# Patient Record
Sex: Female | Born: 1955 | Race: White | Hispanic: No | Marital: Married | Smoking: Never smoker
Health system: Southern US, Community
[De-identification: ages and names within clinical notes are randomized; demographics above are authoritative.]

## PROBLEM LIST (undated history)

## (undated) DIAGNOSIS — K602 Anal fissure, unspecified: Secondary | ICD-10-CM

## (undated) DIAGNOSIS — K649 Unspecified hemorrhoids: Secondary | ICD-10-CM

## (undated) HISTORY — DX: Anal fissure, unspecified: K60.2

## (undated) HISTORY — PX: BUNIONECTOMY: SHX129

## (undated) HISTORY — DX: Unspecified hemorrhoids: K64.9

## (undated) HISTORY — PX: COLONOSCOPY: SHX174

---

## 2002-03-07 HISTORY — PX: HEMORRHOIDECTOMY WITH HEMORRHOID BANDING: SHX5633

## 2004-03-07 HISTORY — PX: ROTATOR CUFF REPAIR: SHX139

## 2004-10-20 ENCOUNTER — Ambulatory Visit: Payer: Self-pay | Admitting: Unknown Physician Specialty

## 2005-10-24 ENCOUNTER — Ambulatory Visit: Payer: Self-pay | Admitting: Unknown Physician Specialty

## 2006-10-26 ENCOUNTER — Ambulatory Visit: Payer: Self-pay | Admitting: Unknown Physician Specialty

## 2007-10-31 ENCOUNTER — Ambulatory Visit: Payer: Self-pay | Admitting: Unknown Physician Specialty

## 2007-11-01 ENCOUNTER — Ambulatory Visit: Payer: Self-pay | Admitting: Unknown Physician Specialty

## 2008-08-06 ENCOUNTER — Ambulatory Visit: Payer: Self-pay | Admitting: Gastroenterology

## 2008-11-05 ENCOUNTER — Ambulatory Visit: Payer: Self-pay | Admitting: Unknown Physician Specialty

## 2009-11-11 ENCOUNTER — Ambulatory Visit: Payer: Self-pay | Admitting: Unknown Physician Specialty

## 2010-03-07 DIAGNOSIS — K649 Unspecified hemorrhoids: Secondary | ICD-10-CM

## 2010-03-07 HISTORY — DX: Unspecified hemorrhoids: K64.9

## 2010-11-15 ENCOUNTER — Ambulatory Visit: Payer: Self-pay | Admitting: Unknown Physician Specialty

## 2011-12-28 ENCOUNTER — Ambulatory Visit: Payer: Self-pay | Admitting: Unknown Physician Specialty

## 2012-01-31 ENCOUNTER — Ambulatory Visit: Payer: Self-pay | Admitting: General Surgery

## 2012-09-20 ENCOUNTER — Encounter: Payer: Self-pay | Admitting: *Deleted

## 2012-12-28 ENCOUNTER — Ambulatory Visit: Payer: Self-pay | Admitting: Family Medicine

## 2014-01-29 ENCOUNTER — Ambulatory Visit: Payer: Self-pay | Admitting: Obstetrics and Gynecology

## 2014-09-11 ENCOUNTER — Telehealth: Payer: Self-pay | Admitting: Obstetrics and Gynecology

## 2014-09-11 MED ORDER — ESTRADIOL 0.1 MG/GM VA CREA: 1.0000 | TOPICAL_CREAM | VAGINAL | Status: DC

## 2014-09-11 NOTE — Telephone Encounter (Signed)
Pt needs refill on vag cream and needs it to be sent to [facility] b/c the other pharmacy can't mis it.

## 2014-09-11 NOTE — Telephone Encounter (Signed)
Pt aware. Pt may need compound. Advised pt if she needs compound to have pharmacy contact office.

## 2014-09-11 NOTE — Telephone Encounter (Signed)
Med erx to [facility].

## 2014-09-12 ENCOUNTER — Telehealth: Payer: Self-pay | Admitting: Obstetrics and Gynecology

## 2014-09-12 NOTE — Telephone Encounter (Signed)
RX FOR THE ESTRACE WAS CALLED IN TO [facility] AND IT WAS WRONG, IT NEEDS TO BE COMPOUND DUE TO THE PRICE AND IT WAS CALLED IN AS COMMERCIAL, CAN YOU CALL IN RX TO PHARMACY [facility] FOR COMPOUND ESTRACE

## 2014-09-15 MED ORDER — ESTRADIOL POWD: 0.5000 g | Status: DC

## 2014-09-15 NOTE — Telephone Encounter (Signed)
Pt aware. Compound estradiol erx and I spoke with randy at [facility].

## 2014-11-18 ENCOUNTER — Telehealth: Payer: Self-pay | Admitting: Obstetrics and Gynecology

## 2014-11-18 NOTE — Telephone Encounter (Signed)
Elaine Graves called yesterday afternoon and wanted to know if she could use vagisil on a somewhat regular basis because it works. Also change her pharmacy to [facility] on [address] please

## 2014-11-19 MED ORDER — CONJ ESTROG-MEDROXYPROGEST ACE 0.625-2.5 MG PO TABS: 1.0000 | ORAL_TABLET | Freq: Every day | ORAL | Status: DC

## 2014-11-19 NOTE — Telephone Encounter (Signed)
Patient called back regarding previous message.  She also wants to make sure prempro is sent in to [facility] pharmacy on [address].

## 2014-11-19 NOTE — Telephone Encounter (Signed)
Spoke with pt see previous documentation.

## 2014-11-19 NOTE — Telephone Encounter (Signed)
Pt states at times she feels raw in the vaginal area. Luvena and vagisil seem to help. Advised her mad she may use vagisil per product insert instructions. Luvena samples left up front for p/u. Estradiol cream is helping with IC. NO d/c or odor.Pt also needs a refill of Prempro sent to [facility].

## 2014-12-11 ENCOUNTER — Telehealth: Payer: Self-pay | Admitting: Obstetrics and Gynecology

## 2014-12-11 NOTE — Telephone Encounter (Signed)
Patient called. Elaine Graves is in [address] and is experiencing vaginal itching that has gotten so bad that her vaginal area is swollen. She didn't know if there is something you could tell her to do that might help since she is out of town. She stated that she is on a tour bus and wont be able to answer her cell but she asked if you could leave a voicemail.Thanks

## 2014-12-11 NOTE — Telephone Encounter (Signed)
Betamethasone biproprioate oint pt is using and this help of which she got from a dermatologist. No vaginal discharge, doesn't think it is yeast. I recommended hair dryer after bathing to dry peri-area and to contact office for an appt if no better upon her return. Not to use any internal vaginal cream day before appt.

## 2015-01-22 ENCOUNTER — Other Ambulatory Visit: Payer: Self-pay | Admitting: Internal Medicine

## 2015-01-22 ENCOUNTER — Ambulatory Visit
Admission: RE | Admit: 2015-01-22 | Discharge: 2015-01-22 | Disposition: A | Discharge status: Home / Self Care | Payer: BLUE CROSS/BLUE SHIELD | Source: Ambulatory Visit | Attending: Internal Medicine | Admitting: Internal Medicine

## 2015-01-22 DIAGNOSIS — R220 Localized swelling, mass and lump, head: Secondary | ICD-10-CM

## 2015-02-02 ENCOUNTER — Other Ambulatory Visit: Payer: Self-pay | Admitting: Obstetrics and Gynecology

## 2015-02-11 ENCOUNTER — Encounter: Payer: Self-pay | Admitting: Obstetrics and Gynecology

## 2015-02-11 ENCOUNTER — Telehealth: Payer: Self-pay | Admitting: Obstetrics and Gynecology

## 2015-02-11 ENCOUNTER — Ambulatory Visit (INDEPENDENT_AMBULATORY_CARE_PROVIDER_SITE_OTHER): Payer: BLUE CROSS/BLUE SHIELD | Admitting: Obstetrics and Gynecology

## 2015-02-11 VITALS — BP 119/72 | HR 80 | Ht 66.0 in | Wt 182.3 lb

## 2015-02-11 DIAGNOSIS — N951 Menopausal and female climacteric states: Secondary | ICD-10-CM | POA: Diagnosis not present

## 2015-02-11 DIAGNOSIS — Z9189 Other specified personal risk factors, not elsewhere classified: Secondary | ICD-10-CM | POA: Diagnosis not present

## 2015-02-11 DIAGNOSIS — N762 Acute vulvitis: Secondary | ICD-10-CM | POA: Diagnosis not present

## 2015-02-11 DIAGNOSIS — Z1239 Encounter for other screening for malignant neoplasm of breast: Secondary | ICD-10-CM | POA: Diagnosis not present

## 2015-02-11 DIAGNOSIS — Z01419 Encounter for gynecological examination (general) (routine) without abnormal findings: Secondary | ICD-10-CM

## 2015-02-11 DIAGNOSIS — N941 Unspecified dyspareunia: Secondary | ICD-10-CM

## 2015-02-11 MED ORDER — ESTRADIOL POWD: 0.5000 g | Status: DC

## 2015-02-11 NOTE — Patient Instructions (Signed)
1. Pap Co-test done today. 2. Mammogram ordered. 3.  Screening labs per primary care. 4.  Continue estrogen vaginal cream twice a week. 5.  Vagisil as needed for vulvar irritation.  If symptoms persist, return for vulvar biopsy to rule out lichen sclerosis or VIN. 6.  Stool cards given for colon cancer screening. 7.  Return in 1 year.

## 2015-02-11 NOTE — Progress Notes (Signed)
Patient ID: Elaine Graves, female   DOB: 1955/12/28, 59 y.o.   MRN: [id number] ANNUAL PREVENTATIVE CARE GYN  ENCOUNTER NOTE  Subjective:       Elaine Graves is a 59 y.o. No obstetric history on file. female here for a routine annual gynecologic exam.  Current complaints: 1.  prempro 0.625-2.5- ? Working well ; patient states that she still has hot flashes, which occur primarily at night; she is uncertain if the HRT is having any benefit regarding vasomotor symptom control.  She does state that sex is much improved with better lubrication. 2. vaginal itching; patient reports off-again on-again vaginal itching without significant discharge.  No recent antibiotic use.   Gynecologic History No LMP recorded. Patient is postmenopausal. Contraception: post menopausal status Last Pap: 08/05/2012 neg/neg. Results were: normal Last mammogram: 2015. Results were: normal  Obstetric History OB History  No data available    Past Medical History  Diagnosis Date  . Unspecified hemorrhoids without mention of complication 2012  . Anal fissure     Past Surgical History  Procedure Laterality Date  . Colonoscopy  2010  . Hemorrhoidectomy with hemorrhoid banding  2004  . Rotator cuff repair  2006    Current Outpatient Prescriptions on File Prior to Visit  Medication Sig Dispense Refill  . Estradiol POWD Place 0.5 g vaginally 2 (two) times a week. 0.2mg /gram compound ----1/2 gram per vaginal twice weekly 45 g 4  . estrogen, conjugated,-medroxyprogesterone (PREMPRO) 0.625-2.5 MG per tablet Take 1 tablet by mouth daily. 30 tablet 2   No current facility-administered medications on file prior to visit.    No Known Allergies  Social History   Social History  . Marital Status: Married    Spouse Name: N/A  . Number of Children: N/A  . Years of Education: N/A   Occupational History  . Not on file.   Social History Main Topics  . Smoking status: Never Smoker   . Smokeless tobacco: Never  Used  . Alcohol Use: Yes     Comment: weekend  . Drug Use: No  . Sexual Activity: Yes    Birth Control/ Protection: Post-menopausal   Other Topics Concern  . Not on file   Social History Narrative    Family History  Problem Relation Age of Onset  . Colon cancer Mother   . Prostate cancer Father   . Breast cancer Neg Hx   . Ovarian cancer Neg Hx   . Diabetes Neg Hx   . Heart disease Neg Hx     The following portions of the patient's history were reviewed and updated as appropriate: allergies, current medications, past family history, past medical history, past social history, past surgical history and problem list.  Review of Systems ROS Review of Systems - General ROS: negative for - chills, fatigue, fever, hot flashes, night sweats, weight gain or weight loss Psychological ROS: negative for - anxiety, decreased libido, depression, mood swings, physical abuse or sexual abuse Ophthalmic ROS: negative for - blurry vision, eye pain or loss of vision ENT ROS: negative for - headaches, hearing change, visual changes or vocal changes Allergy and Immunology ROS: negative for - hives, itchy/watery eyes or seasonal allergies Hematological and Lymphatic ROS: negative for - bleeding problems, bruising, swollen lymph nodes or weight loss Endocrine ROS: negative for - galactorrhea, hair pattern changes, hot flashes, malaise/lethargy, mood swings, palpitations, polydipsia/polyuria, skin changes, temperature intolerance or unexpected weight changes Breast ROS: negative for - new or changing breast lumps or  nipple discharge Respiratory ROS: negative for - cough or shortness of breath Cardiovascular ROS: negative for - chest pain, irregular heartbeat, palpitations or shortness of breath Gastrointestinal ROS: no abdominal pain, change in bowel habits, or black or bloody stools Genito-Urinary ROS: no dysuria, trouble voiding, or hematuria.  POSITIVE-vulvar itching, intermittent Musculoskeletal  ROS: negative for - joint pain or joint stiffness Neurological ROS: negative for - bowel and bladder control changes Dermatological ROS: negative for rash and skin lesion changes   Objective:   BP 119/72 mmHg  Pulse 80  Ht 5\' 6"  (1.676 m)  Wt 182 lb 4.8 oz (82.691 kg)  BMI 29.44 kg/m2 CONSTITUTIONAL: Well-developed, well-nourished female in no acute distress.  PSYCHIATRIC: Normal mood and affect. Normal behavior. Normal judgment and thought content. NEUROLGIC: Alert and oriented to person, place, and time. Normal muscle tone coordination. No cranial nerve deficit noted. HENT:  Normocephalic, atraumatic, External right and left ear normal. Oropharynx is clear and moist EYES: Conjunctivae and EOM are normal. Pupils are equal, round, and reactive to light. No scleral icterus.  NECK: Normal range of motion, supple, no masses.  Normal thyroid.  SKIN: Skin is warm and dry. No rash noted. Not diaphoretic. No erythema. No pallor. CARDIOVASCULAR: Normal heart rate noted, regular rhythm, no murmur. RESPIRATORY: Clear to auscultation bilaterally. Effort and breath sounds normal, no problems with respiration noted. BREASTS: Symmetric in size. No masses, skin changes, nipple drainage, or lymphadenopathy. ABDOMEN: Soft, normal bowel sounds, no distention noted.  No tenderness, rebound or guarding.  BLADDER: Normal PELVIC:  External Genitalia: atrophic changes; questionable faint leukoplakia in the interlabial fold on the right; possible faint superficial erosion at the introitus on right  BUS: Normal  Vagina: Normal estrogen effect  Cervix: Normal; no lesions  Uterus: Normal; midplane, normal size and shape, mobile, nontender  Adnexa: Normal  RV: External Exam NormaI, No Rectal Masses and Normal Sphincter tone  MUSCULOSKELETAL: Normal range of motion. No tenderness.  No cyanosis, clubbing, or edema.  2+ distal pulses. LYMPHATIC: No Axillary, Supraclavicular, or Inguinal  Adenopathy.    Assessment:   Annual gynecologic examination 59 y.o. Contraception: post menopausal status; uncertain effect of HRT.  On vasomotor symptoms at this time bmi 29 Vulvar itching, intermittent, unclear etiology, cannot rule out lichen sclerosus  Plan:  Pap: co-test done Mammogram: Ordered Stool Guaiac Testing:  Ordered Labs: thru pcp Routine preventative health maintenance measures emphasized: Exercise/Diet/Weight control, Tobacco Warnings and Alcohol/Substance use risks May use Vagisil for vulvar symptoms; if itching persists or worsens, return for vulvar biopsy to rule out lichen sclerosus or VIN Return to Clinic - [address]   Crystal HookerMiller, CMA  Herold HarmsMartin A Lakelynn Severtson, MD  Note: This dictation was prepared with Dragon dictation along with smaller phrase technology. Any transcriptional errors that result from this process are unintentional.

## 2015-02-11 NOTE — Telephone Encounter (Signed)
Vernona RiegerLaura informed 1/2 gram IV twice weekly.

## 2015-02-11 NOTE — Telephone Encounter (Signed)
[facility]  Vernona Rieger(Laura)  Said the rx was send with 2 different directions. Please call to clarify.

## 2015-02-19 ENCOUNTER — Ambulatory Visit
Admission: RE | Admit: 2015-02-19 | Discharge: 2015-02-19 | Disposition: A | Discharge status: Home / Self Care | Payer: BLUE CROSS/BLUE SHIELD | Source: Ambulatory Visit | Attending: Obstetrics and Gynecology | Admitting: Obstetrics and Gynecology

## 2015-02-19 DIAGNOSIS — Z1231 Encounter for screening mammogram for malignant neoplasm of breast: Secondary | ICD-10-CM | POA: Diagnosis present

## 2015-02-19 DIAGNOSIS — Z1239 Encounter for other screening for malignant neoplasm of breast: Secondary | ICD-10-CM

## 2015-04-10 ENCOUNTER — Telehealth: Payer: Self-pay

## 2015-04-10 NOTE — Telephone Encounter (Signed)
Call transferred from font desk- States she is having pelvic pressure x 1d. NO burning with urination. Not painful to urinate. NO fevers. She has concerns that she may be getting a UTI. She is on the way to [employer] (flight leaves at 9:00am). She is unable to bring urine sample to office. Advised to push h20 and cranberry juice. May try azo otc. If sx persists ,she will need to drop off urine sample. Pt voices understanding.

## 2015-06-24 ENCOUNTER — Other Ambulatory Visit: Payer: Self-pay | Admitting: Obstetrics and Gynecology

## 2015-10-12 ENCOUNTER — Other Ambulatory Visit: Payer: Self-pay

## 2015-10-12 MED ORDER — ESTRADIOL POWD: 0.5000 g | 4 refills | Status: DC

## 2015-10-28 ENCOUNTER — Telehealth: Payer: Self-pay | Admitting: Obstetrics and Gynecology

## 2015-10-28 NOTE — Telephone Encounter (Signed)
Patient called asking which otc cream she could use for vaginal itching. Thanks

## 2015-10-28 NOTE — Telephone Encounter (Signed)
Pt has been having vulvar itching for 2 years. Advised to make an appt for vuvlar bx. Pt is to call back and make an appt.

## 2015-12-16 ENCOUNTER — Telehealth: Payer: Self-pay | Admitting: Obstetrics and Gynecology

## 2015-12-16 NOTE — Telephone Encounter (Signed)
Appt made for 12/22/15 at 10:30. Pt will have rtn labs drawn that morning also.

## 2015-12-16 NOTE — Telephone Encounter (Signed)
Pt said she is ready for a bx, I didn't know what o schedule.??  Dr Jacques Earthlydef told her she needed a blood panel also can she get that done same day, and she wants late afternoon?

## 2015-12-18 ENCOUNTER — Telehealth: Payer: Self-pay | Admitting: Obstetrics and Gynecology

## 2015-12-18 NOTE — Telephone Encounter (Signed)
Pt called and she stated that she is scheduled for a BX next week and she has a couple of questions that she would like to talk to you about.

## 2015-12-18 NOTE — Telephone Encounter (Signed)
Pts questions answered. Texted my chart sign up.

## 2015-12-22 ENCOUNTER — Ambulatory Visit (INDEPENDENT_AMBULATORY_CARE_PROVIDER_SITE_OTHER): Payer: BLUE CROSS/BLUE SHIELD | Admitting: Obstetrics and Gynecology

## 2015-12-22 ENCOUNTER — Encounter: Payer: Self-pay | Admitting: Obstetrics and Gynecology

## 2015-12-22 VITALS — BP 125/75 | HR 76 | Ht 66.0 in | Wt 177.6 lb

## 2015-12-22 DIAGNOSIS — N941 Unspecified dyspareunia: Secondary | ICD-10-CM

## 2015-12-22 DIAGNOSIS — Z23 Encounter for immunization: Secondary | ICD-10-CM | POA: Diagnosis not present

## 2015-12-22 DIAGNOSIS — N763 Subacute and chronic vulvitis: Secondary | ICD-10-CM | POA: Insufficient documentation

## 2015-12-22 NOTE — Patient Instructions (Signed)
1. Vulvar biopsy is done today 2. Return in 1 week for follow-up 3.  Continue with Estrace cream intravaginal twice weekly   VULVAR BIOPSY POST-PROCEDURE INSTRUCTIONS  1. You may take Ibuprofen, Aleve or Tylenol for pain if needed.    2. You may have a small amount of spotting.  You should wear a mini pad for the next few days.  3. You may use some topical Neosporin ointment if you would like (over the counter is fine).  May use hydrocortisone cream.  4. You need to call if you have redness around the biopsy site, if there is any unusual draining, if the bleeding is heavy, or if you are concerned.  5. Shower or bathe as normal  We will call you within one week with results or we will discuss the results at your follow-up appointment if needed.

## 2015-12-22 NOTE — Progress Notes (Signed)
Chief complaint: 1. Chronic vulvitis 2. Vaginal atrophy  Patient presents for vulvar biopsy And follow-up on vaginal atrophy. She is using Estrace cream intravaginal twice weekly. Vaginal dryness appears to be improving.  OBJECTIVE: BP 125/75   Pulse 76   Ht 5\' 6"  (1.676 m)   Wt 177 lb 9.6 oz (80.6 kg)   BMI 28.67 kg/m   PROCEDURE: Vulvar biopsies Indication: Chronic vulvar itching Biopsies:  Posterior fourchette  Right labia minora Description: Patient gives verbal consent. The vulva is prepped with Betadine  Solution. 2 cc of 1% lidocaine plain is used for anesthesia. A 3.5 mm punch biopsy is taken of the posterior fourchette and right labia minora. 3-0 Vicryl Rapide is used for hemostasis in a simple interrupted manner. Specimens are sent to pathology. Blood loss is minimal. Procedure is well-tolerated. Post procedure instructions are given.  ASSESSMENT: 1. Chronic vulvitis 2. Possible lichen sclerosis 3. Vaginal atrophy, improved  PLAN: 1. Vulvar biopsy 2 2. Return in 1 week for follow-up 3. Post procedure instructions are given 4. Continue Estrace cream intravaginal twice weekly  Herold HarmsMartin A Defrancesco, MD  Note: This dictation was prepared with Dragon dictation along with smaller phrase technology. Any transcriptional errors that result from this process are unintentional.

## 2015-12-23 LAB — GLUCOSE, RANDOM: Glucose: 95 mg/dL (ref 65–99)

## 2015-12-23 LAB — LIPID PANEL
CHOLESTEROL TOTAL: 188 mg/dL (ref 100–199)
Chol/HDL Ratio: 2.5 ratio units (ref 0.0–4.4)
HDL: 74 mg/dL (ref >39)
LDL CALC: 103 mg/dL — AB (ref 0–99)
TRIGLYCERIDES: 55 mg/dL (ref 0–149)
VLDL Cholesterol Cal: 11 mg/dL (ref 5–40)

## 2015-12-23 LAB — VITAMIN D 25 HYDROXY (VIT D DEFICIENCY, FRACTURES): Vit D, 25-Hydroxy: 32.2 ng/mL (ref 30.0–100.0)

## 2015-12-23 LAB — HEMOGLOBIN A1C
Est. average glucose Bld gHb Est-mCnc: 105 mg/dL
HEMOGLOBIN A1C: 5.3 % (ref 4.8–5.6)

## 2015-12-23 LAB — TSH: TSH: 3.98 u[IU]/mL (ref 0.450–4.500)

## 2015-12-24 LAB — PATHOLOGY

## 2015-12-30 ENCOUNTER — Encounter: Payer: Self-pay | Admitting: Obstetrics and Gynecology

## 2015-12-30 ENCOUNTER — Ambulatory Visit (INDEPENDENT_AMBULATORY_CARE_PROVIDER_SITE_OTHER): Payer: BLUE CROSS/BLUE SHIELD | Admitting: Obstetrics and Gynecology

## 2015-12-30 VITALS — BP 115/68 | HR 76 | Ht 66.0 in | Wt 178.5 lb

## 2015-12-30 DIAGNOSIS — L900 Lichen sclerosus et atrophicus: Secondary | ICD-10-CM | POA: Insufficient documentation

## 2015-12-30 MED ORDER — CLOBETASOL PROPIONATE 0.05 % EX OINT: 1.0000 "application " | TOPICAL_OINTMENT | Freq: Two times a day (BID) | CUTANEOUS | 6 refills | Status: DC

## 2015-12-30 NOTE — Progress Notes (Signed)
Chief complaint: 1. Follow-up on vulvar biopsies 2. Chronic vulvar itching  Patient had 2 biopsies last week-posterior fourchette and right labia majora  Pathology: Lichen sclerosus  OBJECTIVE: BP 115/68   Pulse 76   Ht 5\' 6"  (1.676 m)   Wt 178 lb 8 oz (81 kg)   BMI 28.81 kg/m  Pelvic exam: Residual suture noted at posterior fourchette biopsy site. Right labia majora biopsy site healed PELVIC:             External Genitalia: atrophic changes; questionable faint leukoplakia in the interlabial fold on the right; possible faint superficial erosion at the introitus on right             BUS: Normal             Vagina: Normal estrogen effect  ASSESSMENT: 1. Lichen sclerosus, symptomatic  PLAN: 1. Temovate ointment 0.05% topically to the vulva twice a day for 2 weeks, then once a day for 4 weeks 2. Return in 6 weeks for follow-up  A total of 15 minutes were spent face-to-face with the patient during this encounter and over half of that time dealt with counseling and coordination of care.  Herold HarmsMartin A Faust Thorington, MD  Note: This dictation was prepared with Dragon dictation along with smaller phrase technology. Any transcriptional errors that result from this process are unintentional.

## 2015-12-30 NOTE — Patient Instructions (Signed)
1. Apply Temovate ointment 0.05% twice a day for 2 weeks, then once a day for 4 weeks  2. Return in 6 weeks for follow-up

## 2016-02-15 NOTE — Progress Notes (Signed)
Patient ID: Elaine HeronKaren G Graves, female   DOB: 02/21/1956, 60 y.o.   MRN: [id number] ANNUAL PREVENTATIVE CARE GYN  ENCOUNTER NOTE  Subjective:       Elaine HeronKaren G Graves is a 60 y.o. No obstetric history on file. female here for a routine annual gynecologic exam.  Current complaints:  1.  prempro 0.625-2.5 Working well ; patient states that she still has hot flashes, which occur primarily at night; she is uncertain if the HRT is having any benefit regarding vasomotor symptom control.  She does state that sex is much improved with better lubrication.  2.wants to discuss lichens sclerosus; questions regarding lichen sclerosis were answered. Patient understands that biopsy to rule out vulvar cancer as well as vulvar dystrophy had to be performed prior to starting high-dose steroid therapy  3.knee pain -exacerbation of left knee discomfort with recent increased activity and exercise; no history of trauma of the; recommend primary care evaluation for arthritis or cartilage damage with subsequent follow-up therapy as indicated.    Gynecologic History No LMP recorded. Patient is postmenopausal. Contraception: post menopausal status Last Pap: 08/05/2012 neg/neg. Results were: normal Last mammogram: 02/2015 birad 1. Results were: normal  Obstetric History OB History  No data available    Past Medical History:  Diagnosis Date  . Anal fissure   . Unspecified hemorrhoids without mention of complication 2012    Past Surgical History:  Procedure Laterality Date  . COLONOSCOPY  2010  . HEMORRHOIDECTOMY WITH HEMORRHOID BANDING  2004  . ROTATOR CUFF REPAIR  2006    Current Outpatient Prescriptions on File Prior to Visit  Medication Sig Dispense Refill  . clobetasol ointment (TEMOVATE) 0.05 % Apply 1 application topically 2 (two) times daily. Twice daily x 2 weeks, then daily x 4 weeks 30 g 6  . Estradiol POWD Place 0.5 g vaginally 2 (two) times a week. 0.2mg /gram compound ----1/2 gram per vaginal twice  weekly 45 g 4  . PREMPRO 0.625-2.5 MG tablet TAKE ONE (1) TABLET EACH DAY 28 tablet 4   No current facility-administered medications on file prior to visit.     No Known Allergies  Social History   Social History  . Marital status: Married    Spouse name: N/A  . Number of children: N/A  . Years of education: N/A   Occupational History  . Not on file.   Social History Main Topics  . Smoking status: Never Smoker  . Smokeless tobacco: Never Used  . Alcohol use Yes     Comment: weekend  . Drug use: No  . Sexual activity: Yes    Birth control/ protection: Post-menopausal   Other Topics Concern  . Not on file   Social History Narrative  . No narrative on file    Family History  Problem Relation Age of Onset  . Colon cancer Mother   . Prostate cancer Father   . Breast cancer Neg Hx   . Ovarian cancer Neg Hx   . Diabetes Neg Hx   . Heart disease Neg Hx     The following portions of the patient's history were reviewed and updated as appropriate: allergies, current medications, past family history, past medical history, past social history, past surgical history and problem list.  Review of Systems ROS Review of Systems - General ROS: negative for - chills, fatigue, fever, hot flashes, night sweats, weight gain or weight loss Psychological ROS: negative for - anxiety, decreased libido, depression, mood swings, physical abuse or sexual abuse Ophthalmic ROS:  negative for - blurry vision, eye pain or loss of vision ENT ROS: negative for - headaches, hearing change, visual changes or vocal changes Allergy and Immunology ROS: negative for - hives, itchy/watery eyes or seasonal allergies Hematological and Lymphatic ROS: negative for - bleeding problems, bruising, swollen lymph nodes or weight loss Endocrine ROS: negative for - galactorrhea, hair pattern changes, hot flashes, malaise/lethargy, mood swings, palpitations, polydipsia/polyuria, skin changes, temperature intolerance  or unexpected weight changes Breast ROS: negative for - new or changing breast lumps or nipple discharge Respiratory ROS: negative for - cough or shortness of breath Cardiovascular ROS: negative for - chest pain, irregular heartbeat, palpitations or shortness of breath Gastrointestinal ROS: no abdominal pain, change in bowel habits, or black or bloody stools Genito-Urinary ROS: no dysuria, trouble voiding, or hematuria.  POSITIVE-vulvar itching, intermittent Musculoskeletal ROS: negative for - joint pain or joint stiffness Neurological ROS: negative for - bowel and bladder control changes Dermatological ROS: negative for rash and skin lesion changes   Objective:  BP 102/62 (BP Location: Left Arm, Patient Position: Sitting, Cuff Size: Large)   Pulse 76   Ht 5\' 6"  (1.676 m)   Wt 184 lb 11.2 oz (83.8 kg)   BMI 29.81 kg/m  CONSTITUTIONAL: Well-developed, well-nourished female in no acute distress.  PSYCHIATRIC: Normal mood and affect. Normal behavior. Normal judgment and thought content. NEUROLGIC: Alert and oriented to person, place, and time. Normal muscle tone coordination. No cranial nerve deficit noted. HENT:  Normocephalic, atraumatic, External right and left ear normal. Oropharynx is clear and moist EYES: Conjunctivae and EOM are normal. Pupils are equal, round, and reactive to light. No scleral icterus.  NECK: Normal range of motion, supple, no masses.  Normal thyroid.  SKIN: Skin is warm and dry. No rash noted. Not diaphoretic. No erythema. No pallor. CARDIOVASCULAR: Normal heart rate noted, regular rhythm, no murmur. RESPIRATORY: Clear to auscultation bilaterally. Effort and breath sounds normal, no problems with respiration noted. BREASTS: Symmetric in size. No masses, skin changes, nipple drainage, or lymphadenopathy. ABDOMEN: Soft, normal bowel sounds, no distention noted.  No tenderness, rebound or guarding.  BLADDER: Normal PELVIC:  External Genitalia: atrophic changes; No  leukoplakia; no erosions  BUS: Normal  Vagina: Decreased estrogen effect  Cervix: Normal; no lesions; os stenotic  Uterus: Normal; midplane, normal size and shape, mobile, nontender  Adnexa: Normal  RV: External Exam NormaI, No Rectal Masses and Normal Sphincter tone  MUSCULOSKELETAL: Normal range of motion. No tenderness.  No cyanosis, clubbing, or edema.  2+ distal pulses. LYMPHATIC: No Axillary, Supraclavicular, or Inguinal Adenopathy.    Assessment:   Annual gynecologic examination 60 y.o. Contraception: post menopausal status; uncertain effect of HRT.  On vasomotor symptoms at this time bmi 29 Lichen sclerosus, stable Vaginal atrophy, improved with vaginal estrogen therapy Left knee pain   Plan:  Pap: co-test done Mammogram: Ordered Stool Guaiac Testing:  Ordered Labs: thru pcp Routine preventative health maintenance measures emphasized: Exercise/Diet/Weight control, Tobacco Warnings and Alcohol/Substance use risks  Prempro refilled Temovate ointment refilled; applied once or twice a week to vulva; return sooner if increased symptoms of itching burning or pain develop Return to Clinic - 1 Year   SunGardCrystal Miller, CMA  Herold HarmsMartin A Defrancesco, MD  Note: This dictation was prepared with Dragon dictation along with smaller phrase technology. Any transcriptional errors that result from this process are unintentional.   Note: This dictation was prepared with Dragon dictation along with smaller phrase technology. Any transcriptional errors that result from this process  are unintentional.

## 2016-02-18 ENCOUNTER — Ambulatory Visit (INDEPENDENT_AMBULATORY_CARE_PROVIDER_SITE_OTHER): Payer: BLUE CROSS/BLUE SHIELD | Admitting: Obstetrics and Gynecology

## 2016-02-18 ENCOUNTER — Encounter: Payer: Self-pay | Admitting: Obstetrics and Gynecology

## 2016-02-18 VITALS — BP 102/62 | HR 76 | Ht 66.0 in | Wt 184.7 lb

## 2016-02-18 DIAGNOSIS — Z9189 Other specified personal risk factors, not elsewhere classified: Secondary | ICD-10-CM

## 2016-02-18 DIAGNOSIS — Z01419 Encounter for gynecological examination (general) (routine) without abnormal findings: Secondary | ICD-10-CM

## 2016-02-18 DIAGNOSIS — N951 Menopausal and female climacteric states: Secondary | ICD-10-CM | POA: Diagnosis not present

## 2016-02-18 DIAGNOSIS — Z1231 Encounter for screening mammogram for malignant neoplasm of breast: Secondary | ICD-10-CM | POA: Diagnosis not present

## 2016-02-18 DIAGNOSIS — Z1211 Encounter for screening for malignant neoplasm of colon: Secondary | ICD-10-CM

## 2016-02-18 DIAGNOSIS — Z1239 Encounter for other screening for malignant neoplasm of breast: Secondary | ICD-10-CM

## 2016-02-18 DIAGNOSIS — L900 Lichen sclerosus et atrophicus: Secondary | ICD-10-CM

## 2016-02-18 MED ORDER — CONJ ESTROG-MEDROXYPROGEST ACE 0.625-2.5 MG PO TABS: 1.0000 | ORAL_TABLET | Freq: Every day | ORAL | 4 refills | Status: DC

## 2016-02-18 MED ORDER — CLOBETASOL PROPIONATE 0.05 % EX OINT: 1.0000 "application " | TOPICAL_OINTMENT | Freq: Two times a day (BID) | CUTANEOUS | 6 refills | Status: DC

## 2016-02-18 NOTE — Patient Instructions (Signed)
1. Pap smear is done 2. Mammogram is ordered 3. Stool guaiac cards are given 4. Screening labs deferred 5. Recommend calcium with vitamin D supplementation 600 mg/400 international units twice a 6. Prempro 0.625/2.5 mg daily is refilled 7. Temovate ointment 0.05% is refilled; apply topically to the vulva once or twice a week 8. Continue with healthy eating and exercise 9. Return in 1 year   Health Maintenance for Postmenopausal Women Introduction Menopause is a normal process in which your reproductive ability comes to an end. This process happens gradually over a span of months to years, usually between the ages of 50 and 53. Menopause is complete when you have missed 12 consecutive menstrual periods. It is important to talk with your health care provider about some of the most common conditions that affect postmenopausal women, such as heart disease, cancer, and bone loss (osteoporosis). Adopting a healthy lifestyle and getting preventive care can help to promote your health and wellness. Those actions can also lower your chances of developing some of these common conditions. What should I know about menopause? During menopause, you may experience a number of symptoms, such as:  Moderate-to-severe hot flashes.  Night sweats.  Decrease in sex drive.  Mood swings.  Headaches.  Tiredness.  Irritability.  Memory problems.  Insomnia. Choosing to treat or not to treat menopausal changes is an individual decision that you make with your health care provider. What should I know about hormone replacement therapy and supplements? Hormone therapy products are effective for treating symptoms that are associated with menopause, such as hot flashes and night sweats. Hormone replacement carries certain risks, especially as you become older. If you are thinking about using estrogen or estrogen with progestin treatments, discuss the benefits and risks with your health care provider. What  should I know about heart disease and stroke? Heart disease, heart attack, and stroke become more likely as you age. This may be due, in part, to the hormonal changes that your body experiences during menopause. These can affect how your body processes dietary fats, triglycerides, and cholesterol. Heart attack and stroke are both medical emergencies. There are many things that you can do to help prevent heart disease and stroke:  Have your blood pressure checked at least every 1-2 years. High blood pressure causes heart disease and increases the risk of stroke.  If you are 11-63 years old, ask your health care provider if you should take aspirin to prevent a heart attack or a stroke.  Do not use any tobacco products, including cigarettes, chewing tobacco, or electronic cigarettes. If you need help quitting, ask your health care provider.  It is important to eat a healthy diet and maintain a healthy weight.  Be sure to include plenty of vegetables, fruits, low-fat dairy products, and lean protein.  Avoid eating foods that are high in solid fats, added sugars, or salt (sodium).  Get regular exercise. This is one of the most important things that you can do for your health.  Try to exercise for at least 150 minutes each week. The type of exercise that you do should increase your heart rate and make you sweat. This is known as moderate-intensity exercise.  Try to do strengthening exercises at least twice each week. Do these in addition to the moderate-intensity exercise.  Know your numbers.Ask your health care provider to check your cholesterol and your blood glucose. Continue to have your blood tested as directed by your health care provider. What should I know about cancer  screening? There are several types of cancer. Take the following steps to reduce your risk and to catch any cancer development as early as possible. Breast Cancer  Practice breast self-awareness.  This means  understanding how your breasts normally appear and feel.  It also means doing regular breast self-exams. Let your health care provider know about any changes, no matter how small.  If you are 59 or older, have a clinician do a breast exam (clinical breast exam or CBE) every year. Depending on your age, family history, and medical history, it may be recommended that you also have a yearly breast X-ray (mammogram).  If you have a family history of breast cancer, talk with your health care provider about genetic screening.  If you are at high risk for breast cancer, talk with your health care provider about having an MRI and a mammogram every year.  Breast cancer (BRCA) gene test is recommended for women who have family members with BRCA-related cancers. Results of the assessment will determine the need for genetic counseling and BRCA1 and for BRCA2 testing. BRCA-related cancers include these types:  Breast. This occurs in males or females.  Ovarian.  Tubal. This may also be called fallopian tube cancer.  Cancer of the abdominal or pelvic lining (peritoneal cancer).  Prostate.  Pancreatic. Cervical, Uterine, and Ovarian Cancer  Your health care provider may recommend that you be screened regularly for cancer of the pelvic organs. These include your ovaries, uterus, and vagina. This screening involves a pelvic exam, which includes checking for microscopic changes to the surface of your cervix (Pap test).  For women ages 21-65, health care providers may recommend a pelvic exam and a Pap test every three years. For women ages 31-65, they may recommend the Pap test and pelvic exam, combined with testing for human papilloma virus (HPV), every five years. Some types of HPV increase your risk of cervical cancer. Testing for HPV may also be done on women of any age who have unclear Pap test results.  Other health care providers may not recommend any screening for nonpregnant women who are considered  low risk for pelvic cancer and have no symptoms. Ask your health care provider if a screening pelvic exam is right for you.  If you have had past treatment for cervical cancer or a condition that could lead to cancer, you need Pap tests and screening for cancer for at least 20 years after your treatment. If Pap tests have been discontinued for you, your risk factors (such as having a new sexual partner) need to be reassessed to determine if you should start having screenings again. Some women have medical problems that increase the chance of getting cervical cancer. In these cases, your health care provider may recommend that you have screening and Pap tests more often.  If you have a family history of uterine cancer or ovarian cancer, talk with your health care provider about genetic screening.  If you have vaginal bleeding after reaching menopause, tell your health care provider.  There are currently no reliable tests available to screen for ovarian cancer. Lung Cancer  Lung cancer screening is recommended for adults 43-71 years old who are at high risk for lung cancer because of a history of smoking. A yearly low-dose CT scan of the lungs is recommended if you:  Currently smoke.  Have a history of at least 30 pack-years of smoking and you currently smoke or have quit within the past 15 years. A pack-year is smoking  an average of one pack of cigarettes per day for one year. Yearly screening should:  Continue until it has been 15 years since you quit.  Stop if you develop a health problem that would prevent you from having lung cancer treatment. Colorectal Cancer  This type of cancer can be detected and can often be prevented.  Routine colorectal cancer screening usually begins at age 61 and continues through age 10.  If you have risk factors for colon cancer, your health care provider may recommend that you be screened at an earlier age.  If you have a family history of colorectal  cancer, talk with your health care provider about genetic screening.  Your health care provider may also recommend using home test kits to check for hidden blood in your stool.  A small camera at the end of a tube can be used to examine your colon directly (sigmoidoscopy or colonoscopy). This is done to check for the earliest forms of colorectal cancer.  Direct examination of the colon should be repeated every 5-10 years until age 19. However, if early forms of precancerous polyps or small growths are found or if you have a family history or genetic risk for colorectal cancer, you may need to be screened more often. Skin Cancer  Check your skin from head to toe regularly.  Monitor any moles. Be sure to tell your health care provider:  About any new moles or changes in moles, especially if there is a change in a mole's shape or color.  If you have a mole that is larger than the size of a pencil eraser.  If any of your family members has a history of skin cancer, especially at a young age, talk with your health care provider about genetic screening.  Always use sunscreen. Apply sunscreen liberally and repeatedly throughout the day.  Whenever you are outside, protect yourself by wearing long sleeves, pants, a wide-brimmed hat, and sunglasses. What should I know about osteoporosis? Osteoporosis is a condition in which bone destruction happens more quickly than new bone creation. After menopause, you may be at an increased risk for osteoporosis. To help prevent osteoporosis or the bone fractures that can happen because of osteoporosis, the following is recommended:  If you are 31-37 years old, get at least 1,000 mg of calcium and at least 600 mg of vitamin D per day.  If you are older than age 72 but younger than age 84, get at least 1,200 mg of calcium and at least 600 mg of vitamin D per day.  If you are older than age 63, get at least 1,200 mg of calcium and at least 800 mg of vitamin D  per day. Smoking and excessive alcohol intake increase the risk of osteoporosis. Eat foods that are rich in calcium and vitamin D, and do weight-bearing exercises several times each week as directed by your health care provider. What should I know about how menopause affects my mental health? Depression may occur at any age, but it is more common as you become older. Common symptoms of depression include:  Low or sad mood.  Changes in sleep patterns.  Changes in appetite or eating patterns.  Feeling an overall lack of motivation or enjoyment of activities that you previously enjoyed.  Frequent crying spells. Talk with your health care provider if you think that you are experiencing depression. What should I know about immunizations? It is important that you get and maintain your immunizations. These include:  Tetanus, diphtheria,  and pertussis (Tdap) booster vaccine.  Influenza every year before the flu season begins.  Pneumonia vaccine.  Shingles vaccine. Your health care provider may also recommend other immunizations. This information is not intended to replace advice given to you by your health care provider. Make sure you discuss any questions you have with your health care provider. Document Released: 04/15/2005 Document Revised: 09/11/2015 Document Reviewed: 11/25/2014  2017 Elsevier

## 2016-02-22 LAB — PAP IG AND HPV HIGH-RISK
HPV, high-risk: NEGATIVE
PAP SMEAR COMMENT: 0

## 2016-03-28 ENCOUNTER — Other Ambulatory Visit: Payer: Self-pay | Admitting: Internal Medicine

## 2016-03-28 ENCOUNTER — Ambulatory Visit
Admission: RE | Admit: 2016-03-28 | Discharge: 2016-03-28 | Disposition: A | Discharge status: Home / Self Care | Payer: BLUE CROSS/BLUE SHIELD | Source: Ambulatory Visit | Attending: Internal Medicine | Admitting: Internal Medicine

## 2016-03-28 DIAGNOSIS — M25562 Pain in left knee: Secondary | ICD-10-CM

## 2016-03-28 DIAGNOSIS — M1712 Unilateral primary osteoarthritis, left knee: Secondary | ICD-10-CM | POA: Diagnosis not present

## 2016-03-30 ENCOUNTER — Ambulatory Visit
Admission: RE | Admit: 2016-03-30 | Discharge: 2016-03-30 | Disposition: A | Discharge status: Home / Self Care | Payer: BLUE CROSS/BLUE SHIELD | Source: Ambulatory Visit | Attending: Obstetrics and Gynecology | Admitting: Obstetrics and Gynecology

## 2016-03-30 DIAGNOSIS — Z1231 Encounter for screening mammogram for malignant neoplasm of breast: Secondary | ICD-10-CM | POA: Insufficient documentation

## 2016-03-30 DIAGNOSIS — Z01419 Encounter for gynecological examination (general) (routine) without abnormal findings: Secondary | ICD-10-CM

## 2016-11-01 ENCOUNTER — Ambulatory Visit (INDEPENDENT_AMBULATORY_CARE_PROVIDER_SITE_OTHER): Payer: BLUE CROSS/BLUE SHIELD | Admitting: Obstetrics and Gynecology

## 2016-11-01 ENCOUNTER — Encounter: Payer: Self-pay | Admitting: Obstetrics and Gynecology

## 2016-11-01 VITALS — BP 124/72 | HR 78 | Ht 66.0 in | Wt 165.2 lb

## 2016-11-01 DIAGNOSIS — N950 Postmenopausal bleeding: Secondary | ICD-10-CM

## 2016-11-01 NOTE — Progress Notes (Signed)
GYN ENCOUNTER NOTE  Subjective:       Elaine Graves is a 61 y.o. No obstetric history on file. female is here for gynecologic evaluation of the following issues:  1. Postmenopausal bleeding  On Prempro 0.625/2.5 mg daily New onset postmenopausal bleeding last night without associated pelvic pain; patient is status post intercourse last night.   She is taking her Prempro inconsistently. Patient has lost 19 pounds recently due to dieting and exercise   Gynecologic History No LMP recorded. Patient is postmenopausal. Contraception: post menopausal status   Obstetric History OB History  No data available    Past Medical History:  Diagnosis Date  . Anal fissure   . Unspecified hemorrhoids without mention of complication 2012    Past Surgical History:  Procedure Laterality Date  . COLONOSCOPY  2010  . HEMORRHOIDECTOMY WITH HEMORRHOID BANDING  2004  . ROTATOR CUFF REPAIR  2006    Current Outpatient Prescriptions on File Prior to Visit  Medication Sig Dispense Refill  . clobetasol ointment (TEMOVATE) 0.05 % Apply 1 application topically 2 (two) times daily. Apply topically to the vulva once or twice a week 30 g 6  . estrogen, conjugated,-medroxyprogesterone (PREMPRO) 0.625-2.5 MG tablet Take 1 tablet by mouth daily. 28 tablet 4   No current facility-administered medications on file prior to visit.     No Known Allergies  Social History   Social History  . Marital status: Married    Spouse name: N/A  . Number of children: N/A  . Years of education: N/A   Occupational History  . Not on file.   Social History Main Topics  . Smoking status: Never Smoker  . Smokeless tobacco: Never Used  . Alcohol use Yes     Comment: weekend  . Drug use: No  . Sexual activity: Yes    Birth control/ protection: Post-menopausal   Other Topics Concern  . Not on file   Social History Narrative  . No narrative on file    Family History  Problem Relation Age of Onset  . Colon  cancer Mother   . Prostate cancer Father   . Breast cancer Neg Hx   . Ovarian cancer Neg Hx   . Diabetes Neg Hx   . Heart disease Neg Hx     The following portions of the patient's history were reviewed and updated as appropriate: allergies, current medications, past family history, past medical history, past social history, past surgical history and problem list.  Review of Systems Review of Systems  Constitutional: Negative.        No hot flashes  Gastrointestinal: Negative.   Genitourinary: Negative.   Musculoskeletal: Negative.   Skin: Negative.     Objective:   BP 124/72   Pulse 78   Ht 5\' 6"  (1.676 m)   Wt 165 lb 3.2 oz (74.9 kg)   BMI 26.66 kg/m  CONSTITUTIONAL: Well-developed, well-nourished female in no acute distress.  HENT:  Normocephalic, atraumatic.  NECK: Normal range of motion, supple, no masses.  Normal thyroid.  SKIN: Skin is warm and dry. No rash noted. Not diaphoretic. No erythema. No pallor. NEUROLGIC: Alert and oriented to person, place, and time. PSYCHIATRIC: Normal mood and affect. Normal behavior. Normal judgment and thought content. CARDIOVASCULAR:Not Examined RESPIRATORY: Not Examined BREASTS: Not Examined BACK: No CVA tenderness or spinal tenderness ABDOMEN: Soft, non distended; Non tender.  No Organomegaly. PELVIC:  External Genitalia: Normal; old blood noted on perineum  BUS: Normal  Vagina: Normal; no lesions;  no source of bleeding noted on vagina sidewalls  Cervix: Normal; dark burgundy blood coming from os  Uterus: Normal size, shape,consistency, mobile, midplane, nontender  Adnexa: Normal; nonpalpable nontender  RV: Normal external exam  Bladder: Nontender MUSCULOSKELETAL: Normal range of motion. No tenderness.  No cyanosis, clubbing, or edema.  PROCEDURE: Endometrial biopsy Verbal consent is obtained. Patient was placed in the dorsal lithotomy position. Bimanual exam demonstrates a midplane uterus of normal size and shape. Peterson  speculum was placed in the vagina facilitate visualization of the cervix. No vaginal lesions are identified. [place] blood is noted to be exiting the cervix. 3 mm pipette is introduced through the cervix and endocervical canal to the fundus; the sounding is 7 cm. Aspirate biopsy is obtained in standard fashion with production of burgundy blood and tissue. Specimens sent to pathology. Procedure was well-tolerated. Blood loss is minimal. Complications are none.     Assessment:   1. PMB (postmenopausal bleeding) - Urine Culture - POCT urinalysis dipstick - US Pelvis Complete; Future - US Transvaginal Non-OB; Future      Plan:   1. Endometrial biopsy is done 2. Pelvic ultrasound scheduled 3. Return in 10 days for follow-up and further management planning 4. Post procedure instructions were given.  A total of 15 minutes were spent face-to-face with the patient during this encounter and over half of that time dealt with counseling and coordination of care.  Herold Harms, MD  Note: This dictation was prepared with Dragon dictation along with smaller phrase technology. Any transcriptional errors that result from this process are unintentional.

## 2016-11-01 NOTE — Patient Instructions (Signed)
1. Endometrial biopsy is performed today 2. Pelvic ultrasound is scheduled for within 1 week 3. Return in 2 days after ultrasound for further management planning 4. Take Tylenol or ibuprofen or Aleve for any pelvic cramping   Endometrial Biopsy, Care After This sheet gives you information about how to care for yourself after your procedure. Your health care provider may also give you more specific instructions. If you have problems or questions, contact your health care provider. What can I expect after the procedure? After the procedure, it is common to have:  Mild cramping.  A small amount of vaginal bleeding for a few days. This is normal.  Follow these instructions at home:  Take over-the-counter and prescription medicines only as told by your health care provider.  Do not douche, use tampons, or have sexual intercourse until your health care provider approves.  Return to your normal activities as told by your health care provider. Ask your health care provider what activities are safe for you.  Follow instructions from your health care provider about any activity restrictions, such as restrictions on strenuous exercise or heavy lifting. Contact a health care provider if:  You have heavy bleeding, or bleed for longer than 2 days after the procedure.  You have bad smelling discharge from your vagina.  You have a fever or chills.  You have a burning sensation when urinating or you have difficulty urinating.  You have severe pain in your lower abdomen. Get help right away if:  You have severe cramps in your stomach or back.  You pass large blood clots.  Your bleeding increases.  You become weak or light-headed, or you pass out. Summary  After the procedure, it is common to have mild cramping and a small amount of vaginal bleeding for a few days.  Do not douche, use tampons, or have sexual intercourse until your health care provider approves.  Return to your normal  activities as told by your health care provider. Ask your health care provider what activities are safe for you. This information is not intended to replace advice given to you by your health care provider. Make sure you discuss any questions you have with your health care provider. Document Released: 12/12/2012 Document Revised: 03/09/2016 Document Reviewed: 03/09/2016 Elsevier Interactive Patient Education  2017 [employer].

## 2016-11-02 LAB — URINE CULTURE

## 2016-11-03 LAB — PATHOLOGY

## 2016-11-08 ENCOUNTER — Ambulatory Visit (INDEPENDENT_AMBULATORY_CARE_PROVIDER_SITE_OTHER): Payer: BLUE CROSS/BLUE SHIELD

## 2016-11-08 DIAGNOSIS — N950 Postmenopausal bleeding: Secondary | ICD-10-CM | POA: Diagnosis not present

## 2016-11-10 ENCOUNTER — Encounter: Payer: Self-pay | Admitting: Obstetrics and Gynecology

## 2016-11-10 ENCOUNTER — Ambulatory Visit (INDEPENDENT_AMBULATORY_CARE_PROVIDER_SITE_OTHER): Payer: BLUE CROSS/BLUE SHIELD | Admitting: Obstetrics and Gynecology

## 2016-11-10 VITALS — BP 123/77 | HR 75 | Ht 66.0 in | Wt 166.3 lb

## 2016-11-10 DIAGNOSIS — N950 Postmenopausal bleeding: Secondary | ICD-10-CM | POA: Diagnosis not present

## 2016-11-10 NOTE — Progress Notes (Signed)
Chief complaint: 1. Postmenopausal bleeding  SUBJECTIVE: Elaine Graves presents for follow-up on ultrasound and endometrial biopsy performed for evaluation of similar episode of postmenopausal bleeding. She reports no further bleeding or pelvic pain.  Findings from the procedures were reviewed with the patient.  Diagnosis:  ENDOMETRIUM, BIOPSY:  INACTIVE ENDOMETRIUM. NO HYPERPLASIA OR CARCINOMA.  BUL/11/03/2016   ULTRASOUND REPORT  Location: [facility] Date of Service: 11/08/16   Indications:PMB Findings:  The uterus measures 5.6 x 2.4 x 4.1 cm. Echo texture is homogenous without evidence of focal masses.  The Endometrium measures 2.8 mm.  Right Ovary measures 2.5 x 1.3 x 1.7 cm. It is normal in appearance. Left Ovary measures 2.9 x 2 x 1.9 cm. It is normal appearance. Survey of the adnexa demonstrates no adnexal masses. There is no free fluid in the cul de sac.  Impression: 1. Normal appearing pelvic ultrasound  Recommendations: 1.Clinical correlation with the patient's History and Physical Exam.   Shirlean SchleinMaria E Hill  Selina Tapper A Rydan Gulyas, MD  OBJECTIVE: BP 123/77   Pulse 75   Ht 5\' 6"  (1.676 m)   Wt 166 lb 4.8 oz (75.4 kg)   BMI 26.84 kg/m  Physical exam-deferred  ASSESSMENT: 1. Benign endometrial biopsy 2. Normal pelvic ultrasound with thin endometrial stripe  PLAN: 1. Findings were reviewed and explained. 2. Patient is to maintain menstrual calendar monitoring over the next 6 months 3. Return in 6 months for follow-up on menstrual calendar monitoring and further management.  A total of 15 minutes were spent face-to-face with the patient during this encounter and over half of that time dealt with counseling and coordination of care.  Herold HarmsMartin A Carlon Davidson, MD  Note: This dictation was prepared with Dragon dictation along with smaller phrase technology. Any transcriptional errors that result from this process are unintentional.

## 2016-11-10 NOTE — Patient Instructions (Signed)
1. Maintain menstrual calendar monitoring for abnormal uterine bleeding 2. Return in 6 months for follow-up on postmenopausal bleeding 3. No other intervention is necessary at this time.

## 2016-11-11 ENCOUNTER — Encounter: Payer: Self-pay | Admitting: Obstetrics and Gynecology

## 2016-11-15 ENCOUNTER — Encounter: Payer: Self-pay | Admitting: Obstetrics and Gynecology

## 2016-11-17 ENCOUNTER — Encounter: Payer: Self-pay | Admitting: Obstetrics and Gynecology

## 2016-12-26 ENCOUNTER — Encounter: Payer: Self-pay | Admitting: General Surgery

## 2016-12-26 ENCOUNTER — Encounter: Payer: Self-pay | Admitting: Obstetrics and Gynecology

## 2017-02-13 ENCOUNTER — Ambulatory Visit: Payer: Self-pay | Admitting: General Surgery

## 2017-02-14 ENCOUNTER — Encounter: Payer: Self-pay | Admitting: Obstetrics and Gynecology

## 2017-02-20 NOTE — Progress Notes (Signed)
Patient ID: Elaine HeronKaren G Puzio, female   DOB: 08/26/1955, 61 y.o.   MRN: [id number] ANNUAL PREVENTATIVE CARE GYN  ENCOUNTER NOTE  Subjective:       Elaine Graves is a 61 y.o. G3 P3003. female here for a routine annual gynecologic exam.  Current complaints:  1.  prempro 0.625-2.5 Working well ; patient states that she still has hot flashes, which occur primarily at night;  She does state that sex is much improved with better lubrication.  She still reports some discomfort with intercourse but it has improved.  She is willing to do a trial of Premarin cream to help with mild atrophy.  2. Vaginal burning - better after increasing temovate use; diagnosis of lichen sclerosis is primarily at the posterior fourchette.  Recent use of the Temovate has diminished symptoms.  She is encouraged to continue using the Temovate at least twice a week.  3.  Insomnia, work related; discussed possible mindfulness therapy to help with stressors.  Gynecologic History No LMP recorded. Patient is postmenopausal. Contraception: post menopausal status Last Pap: 02/18/2016 neg/neg Results were: normal Last mammogram: 03/31/2016 birad 1 Results were: normal  Obstetric History OB History  No data available    Past Medical History:  Diagnosis Date  . Anal fissure   . Unspecified hemorrhoids without mention of complication 2012    Past Surgical History:  Procedure Laterality Date  . COLONOSCOPY  2010  . HEMORRHOIDECTOMY WITH HEMORRHOID BANDING  2004  . ROTATOR CUFF REPAIR  2006    Current Outpatient Medications on File Prior to Visit  Medication Sig Dispense Refill  . clobetasol ointment (TEMOVATE) 0.05 % Apply 1 application topically 2 (two) times daily. Apply topically to the vulva once or twice a week 30 g 6  . estradiol (ESTRACE) 0.1 MG/GM vaginal cream Place 1 Applicatorful vaginally at bedtime.    Marland Kitchen. estrogen, conjugated,-medroxyprogesterone (PREMPRO) 0.625-2.5 MG tablet Take 1 tablet by mouth daily. 28  tablet 4   No current facility-administered medications on file prior to visit.     No Known Allergies  Social History   Socioeconomic History  . Marital status: Married    Spouse name: Not on file  . Number of children: Not on file  . Years of education: Not on file  . Highest education level: Not on file  Social Needs  . Financial resource strain: Not on file  . Food insecurity - worry: Not on file  . Food insecurity - inability: Not on file  . Transportation needs - medical: Not on file  . Transportation needs - non-medical: Not on file  Occupational History  . Not on file  Tobacco Use  . Smoking status: Never Smoker  . Smokeless tobacco: Never Used  Substance and Sexual Activity  . Alcohol use: Yes    Comment: weekend  . Drug use: No  . Sexual activity: Yes    Birth control/protection: Post-menopausal  Other Topics Concern  . Not on file  Social History Narrative  . Not on file    Family History  Problem Relation Age of Onset  . Colon cancer Mother   . Prostate cancer Father   . Breast cancer Neg Hx   . Ovarian cancer Neg Hx   . Diabetes Neg Hx   . Heart disease Neg Hx     The following portions of the patient's history were reviewed and updated as appropriate: allergies, current medications, past family history, past medical history, past social history, past surgical history and  problem list.  Review of Systems Review of Systems  Constitutional:       Occasional hot flashes at night  HENT: Negative.   Eyes: Negative.   Respiratory: Negative.   Cardiovascular: Negative.   Gastrointestinal: Negative.   Genitourinary: Negative.   Skin: Negative.   Neurological: Negative.   Endo/Heme/Allergies: Negative.   Psychiatric/Behavioral: The patient is nervous/anxious and has insomnia.      Objective:  BP 115/71   Pulse 77   Ht 5\' 6"  (1.676 m)   Wt 169 lb 3.2 oz (76.7 kg)   BMI 27.31 kg/m  CONSTITUTIONAL: Well-developed, well-nourished female in no  acute distress.  PSYCHIATRIC: Normal mood and affect. Normal behavior. Normal judgment and thought content. NEUROLGIC: Alert and oriented to person, place, and time. Normal muscle tone coordination. No cranial nerve deficit noted. HENT:  Normocephalic, atraumatic, External right and left ear normal. Oropharynx is clear and moist EYES: Conjunctivae and EOM are normal.No scleral icterus.  NECK: Normal range of motion, supple, no masses.  Normal thyroid.  SKIN: Skin is warm and dry. No rash noted. Not diaphoretic. No erythema. No pallor. CARDIOVASCULAR: Normal heart rate noted, regular rhythm, no murmur. RESPIRATORY: Clear to auscultation bilaterally. Effort and breath sounds normal, no problems with respiration noted. BREASTS: Symmetric in size. No masses, skin changes, nipple drainage, or lymphadenopathy. ABDOMEN: Soft, normal bowel sounds, no distention noted.  No tenderness, rebound or guarding.  BLADDER: Normal PELVIC:  External Genitalia: atrophic changes; No leukoplakia; no erosions  BUS: Normal  Vagina: Decreased estrogen effect consistent with mild atrophy  Cervix: Normal; no lesions; os stenotic  Uterus: Normal; midplane, normal size and shape, mobile, nontender  Adnexa: Normal  RV: Internal exam deferred due to upcoming colonoscopy next month and External Exam NormaI  MUSCULOSKELETAL: Normal range of motion. No tenderness.  No cyanosis, clubbing, or edema.  2+ distal pulses. LYMPHATIC: No Axillary, Supraclavicular, or Inguinal Adenopathy.    Assessment:   Annual gynecologic examination 61 y.o. Contraception: post menopausal status; uncertain effect of HRT.  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; mindfulness recommended   Plan:  Pap: Due 2020 Mammogram: Ordered Stool Guaiac Testing: colonoscopy in  03/2017 Labs: declines Routine preventative health maintenance measures emphasized: Exercise/Diet/Weight control, Tobacco Warnings and Alcohol/Substance use risks  Prempro refilled for 1 year Trial of Premarin cream intravaginal twice a week is recommended Temovate ointment refilled; applied once or twice a week to vulva; return sooner if increased symptoms of itching burning or pain develop Mindfulness therapy; [facility] [place] referral information Return to Clinic - 1 Year   Crystal Cro[place] Park[place], CMA  Herold HarmsMartin A Shanicka Oldenkamp, MD   Note: This dictation was prepared with Dragon dictation along with smaller phrase technology. Any transcriptional errors that result from this process are unintentional.

## 2017-02-22 ENCOUNTER — Encounter: Payer: Self-pay | Admitting: Obstetrics and Gynecology

## 2017-02-22 ENCOUNTER — Ambulatory Visit (INDEPENDENT_AMBULATORY_CARE_PROVIDER_SITE_OTHER): Payer: BLUE CROSS/BLUE SHIELD | Admitting: Obstetrics and Gynecology

## 2017-02-22 VITALS — BP 115/71 | HR 77 | Ht 66.0 in | Wt 169.2 lb

## 2017-02-22 DIAGNOSIS — N763 Subacute and chronic vulvitis: Secondary | ICD-10-CM

## 2017-02-22 DIAGNOSIS — Z23 Encounter for immunization: Secondary | ICD-10-CM

## 2017-02-22 DIAGNOSIS — Z1239 Encounter for other screening for malignant neoplasm of breast: Secondary | ICD-10-CM

## 2017-02-22 DIAGNOSIS — Z1231 Encounter for screening mammogram for malignant neoplasm of breast: Secondary | ICD-10-CM

## 2017-02-22 DIAGNOSIS — Z9189 Other specified personal risk factors, not elsewhere classified: Secondary | ICD-10-CM | POA: Diagnosis not present

## 2017-02-22 DIAGNOSIS — L900 Lichen sclerosus et atrophicus: Secondary | ICD-10-CM

## 2017-02-22 DIAGNOSIS — F5104 Psychophysiologic insomnia: Secondary | ICD-10-CM | POA: Diagnosis not present

## 2017-02-22 DIAGNOSIS — Z01419 Encounter for gynecological examination (general) (routine) without abnormal findings: Secondary | ICD-10-CM | POA: Diagnosis not present

## 2017-02-22 DIAGNOSIS — Z1211 Encounter for screening for malignant neoplasm of colon: Secondary | ICD-10-CM

## 2017-02-22 MED ORDER — ESTRADIOL 0.1 MG/GM VA CREA: 1.0000 | TOPICAL_CREAM | Freq: Every day | VAGINAL | 3 refills | Status: DC

## 2017-02-22 MED ORDER — CLOBETASOL PROPIONATE 0.05 % EX OINT: 1.0000 "application " | TOPICAL_OINTMENT | Freq: Two times a day (BID) | CUTANEOUS | 6 refills | Status: DC

## 2017-02-22 MED ORDER — CONJ ESTROG-MEDROXYPROGEST ACE 0.625-2.5 MG PO TABS: 1.0000 | ORAL_TABLET | Freq: Every day | ORAL | 4 refills | Status: DC

## 2017-02-22 NOTE — Patient Instructions (Addendum)
1.  Pap smear not done.  Next Pap smear is due in 2020 2.  Mammogram is ordered 3.  Colonoscopy is scheduled for January 2019 4.  Screening labs are declined 5.  Continue with healthy eating and exercise 6.  Prempro was refilled 0.65/1.25 mg daily 7.  Patient is to continue Temovate ointment topically to the vulva twice a week as needed for lichen sclerosis 8.  Return in 1 year for annual exam 9.  Consider mindfulness therapy for helping with insomnia related to work anxieties 10.  Consider trial of Premarin cream intravaginal twice weekly for dryness and dyspareunia   Health Maintenance for Postmenopausal Women Menopause is a normal process in which your reproductive ability comes to an end. This process happens gradually over a span of months to years, usually between the ages of 29 and 49. Menopause is complete when you have missed 12 consecutive menstrual periods. It is important to talk with your health care provider about some of the most common conditions that affect postmenopausal women, such as heart disease, cancer, and bone loss (osteoporosis). Adopting a healthy lifestyle and getting preventive care can help to promote your health and wellness. Those actions can also lower your chances of developing some of these common conditions. What should I know about menopause? During menopause, you may experience a number of symptoms, such as:  Moderate-to-severe hot flashes.  Night sweats.  Decrease in sex drive.  Mood swings.  Headaches.  Tiredness.  Irritability.  Memory problems.  Insomnia.  Choosing to treat or not to treat menopausal changes is an individual decision that you make with your health care provider. What should I know about hormone replacement therapy and supplements? Hormone therapy products are effective for treating symptoms that are associated with menopause, such as hot flashes and night sweats. Hormone replacement carries certain risks, especially as  you become older. If you are thinking about using estrogen or estrogen with progestin treatments, discuss the benefits and risks with your health care provider. What should I know about heart disease and stroke? Heart disease, heart attack, and stroke become more likely as you age. This may be due, in part, to the hormonal changes that your body experiences during menopause. These can affect how your body processes dietary fats, triglycerides, and cholesterol. Heart attack and stroke are both medical emergencies. There are many things that you can do to help prevent heart disease and stroke:  Have your blood pressure checked at least every 1-2 years. High blood pressure causes heart disease and increases the risk of stroke.  If you are 99-5 years old, ask your health care provider if you should take aspirin to prevent a heart attack or a stroke.  Do not use any tobacco products, including cigarettes, chewing tobacco, or electronic cigarettes. If you need help quitting, ask your health care provider.  It is important to eat a healthy diet and maintain a healthy weight. ? Be sure to include plenty of vegetables, fruits, low-fat dairy products, and lean protein. ? Avoid eating foods that are high in solid fats, added sugars, or salt (sodium).  Get regular exercise. This is one of the most important things that you can do for your health. ? Try to exercise for at least 150 minutes each week. The type of exercise that you do should increase your heart rate and make you sweat. This is known as moderate-intensity exercise. ? Try to do strengthening exercises at least twice each week. Do these in addition to  the moderate-intensity exercise.  Know your numbers.Ask your health care provider to check your cholesterol and your blood glucose. Continue to have your blood tested as directed by your health care provider.  What should I know about cancer screening? There are several types of cancer. Take the  following steps to reduce your risk and to catch any cancer development as early as possible. Breast Cancer  Practice breast self-awareness. ? This means understanding how your breasts normally appear and feel. ? It also means doing regular breast self-exams. Let your health care provider know about any changes, no matter how small.  If you are 67 or older, have a clinician do a breast exam (clinical breast exam or CBE) every year. Depending on your age, family history, and medical history, it may be recommended that you also have a yearly breast X-ray (mammogram).  If you have a family history of breast cancer, talk with your health care provider about genetic screening.  If you are at high risk for breast cancer, talk with your health care provider about having an MRI and a mammogram every year.  Breast cancer (BRCA) gene test is recommended for women who have family members with BRCA-related cancers. Results of the assessment will determine the need for genetic counseling and BRCA1 and for BRCA2 testing. BRCA-related cancers include these types: ? Breast. This occurs in males or females. ? Ovarian. ? Tubal. This may also be called fallopian tube cancer. ? Cancer of the abdominal or pelvic lining (peritoneal cancer). ? Prostate. ? Pancreatic.  Cervical, Uterine, and Ovarian Cancer Your health care provider may recommend that you be screened regularly for cancer of the pelvic organs. These include your ovaries, uterus, and vagina. This screening involves a pelvic exam, which includes checking for microscopic changes to the surface of your cervix (Pap test).  For women ages 21-65, health care providers may recommend a pelvic exam and a Pap test every three years. For women ages 83-65, they may recommend the Pap test and pelvic exam, combined with testing for human papilloma virus (HPV), every five years. Some types of HPV increase your risk of cervical cancer. Testing for HPV may also be done  on women of any age who have unclear Pap test results.  Other health care providers may not recommend any screening for nonpregnant women who are considered low risk for pelvic cancer and have no symptoms. Ask your health care provider if a screening pelvic exam is right for you.  If you have had past treatment for cervical cancer or a condition that could lead to cancer, you need Pap tests and screening for cancer for at least 20 years after your treatment. If Pap tests have been discontinued for you, your risk factors (such as having a new sexual partner) need to be reassessed to determine if you should start having screenings again. Some women have medical problems that increase the chance of getting cervical cancer. In these cases, your health care provider may recommend that you have screening and Pap tests more often.  If you have a family history of uterine cancer or ovarian cancer, talk with your health care provider about genetic screening.  If you have vaginal bleeding after reaching menopause, tell your health care provider.  There are currently no reliable tests available to screen for ovarian cancer.  Lung Cancer Lung cancer screening is recommended for adults 7-49 years old who are at high risk for lung cancer because of a history of smoking. A yearly low-dose  CT scan of the lungs is recommended if you:  Currently smoke.  Have a history of at least 30 pack-years of smoking and you currently smoke or have quit within the past 15 years. A pack-year is smoking an average of one pack of cigarettes per day for one year.  Yearly screening should:  Continue until it has been 15 years since you quit.  Stop if you develop a health problem that would prevent you from having lung cancer treatment.  Colorectal Cancer  This type of cancer can be detected and can often be prevented.  Routine colorectal cancer screening usually begins at age 23 and continues through age 44.  If you  have risk factors for colon cancer, your health care provider may recommend that you be screened at an earlier age.  If you have a family history of colorectal cancer, talk with your health care provider about genetic screening.  Your health care provider may also recommend using home test kits to check for hidden blood in your stool.  A small camera at the end of a tube can be used to examine your colon directly (sigmoidoscopy or colonoscopy). This is done to check for the earliest forms of colorectal cancer.  Direct examination of the colon should be repeated every 5-10 years until age 70. However, if early forms of precancerous polyps or small growths are found or if you have a family history or genetic risk for colorectal cancer, you may need to be screened more often.  Skin Cancer  Check your skin from head to toe regularly.  Monitor any moles. Be sure to tell your health care provider: ? About any new moles or changes in moles, especially if there is a change in a mole's shape or color. ? If you have a mole that is larger than the size of a pencil eraser.  If any of your family members has a history of skin cancer, especially at a young age, talk with your health care provider about genetic screening.  Always use sunscreen. Apply sunscreen liberally and repeatedly throughout the day.  Whenever you are outside, protect yourself by wearing long sleeves, pants, a wide-brimmed hat, and sunglasses.  What should I know about osteoporosis? Osteoporosis is a condition in which bone destruction happens more quickly than new bone creation. After menopause, you may be at an increased risk for osteoporosis. To help prevent osteoporosis or the bone fractures that can happen because of osteoporosis, the following is recommended:  If you are 32-36 years old, get at least 1,000 mg of calcium and at least 600 mg of vitamin D per day.  If you are older than age 51 but younger than age 59, get at  least 1,200 mg of calcium and at least 600 mg of vitamin D per day.  If you are older than age 16, get at least 1,200 mg of calcium and at least 800 mg of vitamin D per day.  Smoking and excessive alcohol intake increase the risk of osteoporosis. Eat foods that are rich in calcium and vitamin D, and do weight-bearing exercises several times each week as directed by your health care provider. What should I know about how menopause affects my mental health? Depression may occur at any age, but it is more common as you become older. Common symptoms of depression include:  Low or sad mood.  Changes in sleep patterns.  Changes in appetite or eating patterns.  Feeling an overall lack of motivation or enjoyment of  activities that you previously enjoyed.  Frequent crying spells.  Talk with your health care provider if you think that you are experiencing depression. What should I know about immunizations? It is important that you get and maintain your immunizations. These include:  Tetanus, diphtheria, and pertussis (Tdap) booster vaccine.  Influenza every year before the flu season begins.  Pneumonia vaccine.  Shingles vaccine.  Your health care provider may also recommend other immunizations. This information is not intended to replace advice given to you by your health care provider. Make sure you discuss any questions you have with your health care provider. Document Released: 04/15/2005 Document Revised: 09/11/2015 Document Reviewed: 11/25/2014 Elsevier Interactive Patient Education  2018 [employer].

## 2017-03-14 ENCOUNTER — Encounter: Payer: Self-pay | Admitting: General Surgery

## 2017-03-14 ENCOUNTER — Ambulatory Visit: Payer: BLUE CROSS/BLUE SHIELD | Admitting: General Surgery

## 2017-03-14 VITALS — BP 128/74 | HR 74 | Resp 12 | Ht 66.0 in | Wt 171.0 lb

## 2017-03-14 DIAGNOSIS — Z800 Family history of malignant neoplasm of digestive organs: Secondary | ICD-10-CM | POA: Diagnosis not present

## 2017-03-14 NOTE — Patient Instructions (Signed)
Colonoscopy, Adult A colonoscopy is an exam to look at the entire large intestine. During the exam, a lubricated, bendable tube is inserted into the anus and then passed into the rectum, colon, and other parts of the large intestine. A colonoscopy is often done as a part of normal colorectal screening or in response to certain symptoms, such as anemia, persistent diarrhea, abdominal pain, and blood in the stool. The exam can help screen for and diagnose medical problems, including:  Tumors.  Polyps.  Inflammation.  Areas of bleeding.  Tell a health care provider about:  Any allergies you have.  All medicines you are taking, including vitamins, herbs, eye drops, creams, and over-the-counter medicines.  Any problems you or family members have had with anesthetic medicines.  Any blood disorders you have.  Any surgeries you have had.  Any medical conditions you have.  Any problems you have had passing stool. What are the risks? Generally, this is a safe procedure. However, problems may occur, including:  Bleeding.  A tear in the intestine.  A reaction to medicines given during the exam.  Infection (rare).  What happens before the procedure? Eating and drinking restrictions Follow instructions from your health care provider about eating and drinking, which may include:  A few days before the procedure - follow a low-fiber diet. Avoid nuts, seeds, dried fruit, raw fruits, and vegetables.  1-3 days before the procedure - follow a clear liquid diet. Drink only clear liquids, such as clear broth or bouillon, black coffee or tea, clear juice, clear soft drinks or sports drinks, gelatin dessert, and popsicles. Avoid any liquids that contain red or purple dye.  On the day of the procedure - do not eat or drink anything during the 2 hours before the procedure, or within the time period that your health care provider recommends.  Bowel prep If you were prescribed an oral bowel prep  to clean out your colon:  Take it as told by your health care provider. Starting the day before your procedure, you will need to drink a large amount of medicated liquid. The liquid will cause you to have multiple loose stools until your stool is almost clear or light green.  If your skin or anus gets irritated from diarrhea, you may use these to relieve the irritation: ? Medicated wipes, such as adult wet wipes with aloe and vitamin E. ? A skin soothing-product like petroleum jelly.  If you vomit while drinking the bowel prep, take a break for up to 60 minutes and then begin the bowel prep again. If vomiting continues and you cannot take the bowel prep without vomiting, call your health care provider.  General instructions  Ask your health care provider about changing or stopping your regular medicines. This is especially important if you are taking diabetes medicines or blood thinners.  Plan to have someone take you home from the hospital or clinic. What happens during the procedure?  An IV tube may be inserted into one of your veins.  You will be given medicine to help you relax (sedative).  To reduce your risk of infection: ? Your health care team will wash or sanitize their hands. ? Your anal area will be washed with soap.  You will be asked to lie on your side with your knees bent.  Your health care provider will lubricate a long, thin, flexible tube. The tube will have a camera and a light on the end.  The tube will be inserted into your   anus.  The tube will be gently eased through your rectum and colon.  Air will be delivered into your colon to keep it open. You may feel some pressure or cramping.  The camera will be used to take images during the procedure.  A small tissue sample may be removed from your body to be examined under a microscope (biopsy). If any potential problems are found, the tissue will be sent to a lab for testing.  If small polyps are found, your  health care provider may remove them and have them checked for cancer cells.  The tube that was inserted into your anus will be slowly removed. The procedure may vary among health care providers and hospitals. What happens after the procedure?  Your blood pressure, heart rate, breathing rate, and blood oxygen level will be monitored until the medicines you were given have worn off.  Do not drive for 24 hours after the exam.  You may have a small amount of blood in your stool.  You may pass gas and have mild abdominal cramping or bloating due to the air that was used to inflate your colon during the exam.  It is up to you to get the results of your procedure. Ask your health care provider, or the department performing the procedure, when your results will be ready. This information is not intended to replace advice given to you by your health care provider. Make sure you discuss any questions you have with your health care provider. Document Released: 02/19/2000 Document Revised: 12/23/2015 Document Reviewed: 05/05/2015 Elsevier Interactive Patient Education  2018 [employer]  

## 2017-03-14 NOTE — Progress Notes (Signed)
Patient ID: Elaine Graves, female   DOB: December 25, 1955, 62 y.o.   MRN: [id number]  Chief Complaint  Patient presents with  . Colonoscopy    HPI Elaine Graves is a 62 y.o. female here today for a evaluation  of a colonoscopy. Patient last colonoscopy was on 01/28/2012. Patient states no GI problems at this time. Moves her bowels every two days..   Her mom had colon cancer in her 83s, but had suffered from ulcerative colitis for decades prior to diagnosis..  The patient was last seen in this office in November 2013 when she presented with a 3-40-month history of intermittent rectal bleeding.  I merrily on the toilet paper, bright red, associated with razor-like pain at the anus with defecation.  Colonoscopy at that time was negative.  Prior history for rectal bleeding in August 2012.  Patient had a colonoscopy in 1999 with Lynnae Prude MD at which time an adenomatous polyp was identified.  Examination in 2003 was negative.  Colonoscopy in 2010 with Midge Minium, D was negative.  Past Medical History:  Diagnosis Date  . Anal fissure   . Unspecified hemorrhoids without mention of complication 2012    Past Surgical History:  Procedure Laterality Date  . COLONOSCOPY  2010,01/28/2012  . HEMORRHOIDECTOMY WITH HEMORRHOID BANDING  2004  . ROTATOR CUFF REPAIR  2006    Family History  Problem Relation Age of Onset  . Colon cancer Mother   . Prostate cancer Father   . Breast cancer Neg Hx   . Ovarian cancer Neg Hx   . Diabetes Neg Hx   . Heart disease Neg Hx     Social History Social History   Tobacco Use  . Smoking status: Never Smoker  . Smokeless tobacco: Never Used  Substance Use Topics  . Alcohol use: Yes    Comment: weekend  . Drug use: No    No Known Allergies  Current Outpatient Medications  Medication Sig Dispense Refill  . clobetasol ointment (TEMOVATE) 0.05 % Apply 1 application topically 2 (two) times daily. Apply topically to the vulva once or twice a week 30  g 6  . estradiol (ESTRACE) 0.1 MG/GM vaginal cream Place 1 Applicatorful vaginally at bedtime. 42.5 g 3  . estrogen, conjugated,-medroxyprogesterone (PREMPRO) 0.625-2.5 MG tablet Take 1 tablet by mouth daily. 28 tablet 4   No current facility-administered medications for this visit.     Review of Systems Review of Systems  Constitutional: Negative.   Respiratory: Negative.   Cardiovascular: Negative.   Gastrointestinal: Negative.     Blood pressure 128/74, pulse 74, resp. rate 12, height 5\' 6"  (1.676 m), weight 171 lb (77.6 kg).  Physical Exam Physical Exam  Constitutional: She is oriented to person, place, and time. She appears well-developed and well-nourished.  Cardiovascular: Normal rate, regular rhythm and normal heart sounds.  Pulmonary/Chest: Effort normal and breath sounds normal.  Neurological: She is alert and oriented to person, place, and time.  Skin: Skin is warm and dry.    Data Reviewed 2003, 2010, 2013 colonoscopy examination reports.  Assessment    The patient had been called in for an exam based on family history.  She is got a question of whether her mother's history of ulcerative colitis should be considered a mitigating factor and that her risk is truly that of the average population.    Plan    We will discuss with the gastroenterology department their recommendations for follow-up based on the above history.  Colonoscopy with possible biopsy/polypectomy prn: Information regarding the procedure, including its potential risks and complications (including but not limited to perforation of the bowel, which may require emergency surgery to repair, and bleeding) was verbally given to the patient. Educational information regarding lower intestinal endoscopy was given to the patient. Written instructions for how to complete the bowel prep using Miralax were provided. The importance of drinking ample fluids to avoid dehydration as a result of the prep  emphasized.   Follow up appointment to be announced. We will send a my chart message or call patient to scheduled if needed.   HPI, Physical Exam, Assessment and Plan have been scribed under the direction and in the presence of Donnalee CurryJeffrey Grantland Want, MD.  Ples SpecterJessica Qualls, CMA  I have completed the exam and reviewed the above documentation for accuracy and completeness.  I agree with the above.  [employer] has been used and any errors in dictation or transcription are unintentional.  Donnalee CurryJeffrey Colbey Wirtanen, M.D., F.A.C.S.  Earline MayotteByrnett, Kehinde Totzke W 03/14/2017, 11:45 AM

## 2017-03-15 ENCOUNTER — Telehealth: Payer: Self-pay | Admitting: *Deleted

## 2017-03-15 MED ORDER — POLYETHYLENE GLYCOL 3350 17 GM/SCOOP PO POWD: ORAL | 0 refills | Status: DC

## 2017-03-15 NOTE — Telephone Encounter (Signed)
-----   Message from Earline MayotteJeffrey W Byrnett, MD sent at 03/15/2017  8:33 AM EST ----- I spoke w/ GI MD re: risk with Mom's history of colon cancer and ulcerative colitis. No data to suggest she should be screened as the general population (10 years). Would recommend 5 year follow up exams.

## 2017-03-15 NOTE — Telephone Encounter (Signed)
Spoke with the patient and let her know that colonoscopy screening every 5 years would be appropriate. She will speak with her husband about what date she wants and then call us back to schedule her Colonoscopy with Dr Lemar LivingsByrnett.

## 2017-03-15 NOTE — Telephone Encounter (Signed)
Patient has been scheduled for [facility]. Miralax prescription has been sent in to the patient's pharmacy today. Colonoscopy instructions were provided to the patient yesterday at time of office visit. This patient is aware to call the office if they have further questions.

## 2017-03-16 ENCOUNTER — Other Ambulatory Visit: Payer: Self-pay | Admitting: General Surgery

## 2017-03-16 DIAGNOSIS — Z800 Family history of malignant neoplasm of digestive organs: Secondary | ICD-10-CM

## 2017-03-16 NOTE — Progress Notes (Signed)
The patient's case was reviewed with 2 gastroenterologist.  They did not think the family history of ulcerative colitis prior to her mother's colon cancer changed her overall risk is a first-degree relative and recommended a 5-year follow-up exam.

## 2017-03-22 ENCOUNTER — Other Ambulatory Visit: Payer: Self-pay

## 2017-03-22 ENCOUNTER — Ambulatory Visit: Payer: BLUE CROSS/BLUE SHIELD | Admitting: Anesthesiology

## 2017-03-22 ENCOUNTER — Encounter
Admission: RE | Disposition: A | Discharge status: Home / Self Care | Payer: Self-pay | Source: Ambulatory Visit | Attending: General Surgery

## 2017-03-22 ENCOUNTER — Ambulatory Visit
Admission: RE | Admit: 2017-03-22 | Discharge: 2017-03-22 | Disposition: A | Discharge status: Home / Self Care | Payer: BLUE CROSS/BLUE SHIELD | Source: Ambulatory Visit | Attending: General Surgery | Admitting: General Surgery

## 2017-03-22 ENCOUNTER — Encounter: Payer: Self-pay | Admitting: *Deleted

## 2017-03-22 DIAGNOSIS — K5730 Diverticulosis of large intestine without perforation or abscess without bleeding: Secondary | ICD-10-CM | POA: Diagnosis not present

## 2017-03-22 DIAGNOSIS — Z800 Family history of malignant neoplasm of digestive organs: Secondary | ICD-10-CM | POA: Insufficient documentation

## 2017-03-22 DIAGNOSIS — Z79890 Hormone replacement therapy (postmenopausal): Secondary | ICD-10-CM | POA: Insufficient documentation

## 2017-03-22 DIAGNOSIS — Z79899 Other long term (current) drug therapy: Secondary | ICD-10-CM | POA: Insufficient documentation

## 2017-03-22 DIAGNOSIS — Z1211 Encounter for screening for malignant neoplasm of colon: Secondary | ICD-10-CM | POA: Insufficient documentation

## 2017-03-22 DIAGNOSIS — Z86010 Personal history of colonic polyps: Secondary | ICD-10-CM | POA: Insufficient documentation

## 2017-03-22 DIAGNOSIS — Z9889 Other specified postprocedural states: Secondary | ICD-10-CM | POA: Diagnosis not present

## 2017-03-22 DIAGNOSIS — Z8042 Family history of malignant neoplasm of prostate: Secondary | ICD-10-CM | POA: Insufficient documentation

## 2017-03-22 HISTORY — PX: COLONOSCOPY WITH PROPOFOL: SHX5780

## 2017-03-22 SURGERY — COLONOSCOPY WITH PROPOFOL
Anesthesia: General

## 2017-03-22 MED ORDER — PROPOFOL 10 MG/ML IV BOLUS
INTRAVENOUS | Status: DC | PRN
  Administered 2017-03-22: 100 mg via INTRAVENOUS

## 2017-03-22 MED ORDER — FENTANYL CITRATE (PF) 100 MCG/2ML IJ SOLN
INTRAMUSCULAR | Status: DC | PRN
  Administered 2017-03-22 (×2): 50 ug via INTRAVENOUS

## 2017-03-22 MED ORDER — SODIUM CHLORIDE 0.9 % IV SOLN
INTRAVENOUS | Status: DC
  Administered 2017-03-22: 10:00:00 via INTRAVENOUS

## 2017-03-22 MED ORDER — LIDOCAINE HCL (PF) 2 % IJ SOLN
INTRAMUSCULAR | Status: AC
  Filled 2017-03-22: qty 10

## 2017-03-22 MED ORDER — FENTANYL CITRATE (PF) 100 MCG/2ML IJ SOLN
INTRAMUSCULAR | Status: AC
  Filled 2017-03-22: qty 2

## 2017-03-22 MED ORDER — PROPOFOL 500 MG/50ML IV EMUL
INTRAVENOUS | Status: DC | PRN
  Administered 2017-03-22: 140 ug/kg/min via INTRAVENOUS

## 2017-03-22 MED ORDER — PHENYLEPHRINE HCL 10 MG/ML IJ SOLN
INTRAMUSCULAR | Status: DC | PRN
  Administered 2017-03-22 (×2): 100 ug via INTRAVENOUS

## 2017-03-22 MED ORDER — PROPOFOL 500 MG/50ML IV EMUL
INTRAVENOUS | Status: AC
  Filled 2017-03-22: qty 50

## 2017-03-22 MED ORDER — PROPOFOL 10 MG/ML IV BOLUS
INTRAVENOUS | Status: AC
  Filled 2017-03-22: qty 20

## 2017-03-22 MED ORDER — LIDOCAINE 2% (20 MG/ML) 5 ML SYRINGE
INTRAMUSCULAR | Status: DC | PRN
  Administered 2017-03-22: 40 mg via INTRAVENOUS

## 2017-03-22 NOTE — H&P (Signed)
No change in clinical history or exam. For colonoscopy. Tolerated the prep well.

## 2017-03-22 NOTE — Transfer of Care (Signed)
Immediate Anesthesia Transfer of Care Note  Patient: Elaine HeronKaren G Baer  Procedure(s) Performed: COLONOSCOPY WITH PROPOFOL (N/A )  Patient Location: PACU and Endoscopy Unit  Anesthesia Type:General  Level of Consciousness: sedated  Airway & Oxygen Therapy: Patient Spontanous Breathing and Patient connected to nasal cannula oxygen  Post-op Assessment: Report given to RN and Post -op Vital signs reviewed and stable  Post vital signs: Reviewed and stable  Last Vitals:  Vitals:   03/22/17 1001  BP: 123/68  Pulse: 78  Resp: 16  Temp: 36.5 C  SpO2: 100%    Last Pain:  Vitals:   03/22/17 1001  TempSrc: Tympanic         Complications: No apparent anesthesia complications

## 2017-03-22 NOTE — Anesthesia Preprocedure Evaluation (Addendum)
Anesthesia Evaluation  Patient identified by MRN, date of birth, ID band Patient awake    Reviewed: Allergy & Precautions, NPO status , Patient's Chart, lab work & pertinent test results  Airway Mallampati: II  TM Distance: <3 FB     Dental  (+) Caps   Pulmonary neg pulmonary ROS,    Pulmonary exam normal        Cardiovascular negative cardio ROS Normal cardiovascular exam     Neuro/Psych negative neurological ROS  negative psych ROS   GI/Hepatic Neg liver ROS, Anal fissure   Endo/Other  negative endocrine ROS  Renal/GU negative Renal ROS  negative genitourinary   Musculoskeletal negative musculoskeletal ROS (+)   Abdominal Normal abdominal exam  (+)   Peds negative pediatric ROS (+)  Hematology negative hematology ROS (+)   Anesthesia Other Findings   Reproductive/Obstetrics                            Anesthesia Physical Anesthesia Plan  ASA: I  Anesthesia Plan: General   Post-op Pain Management:    Induction: Intravenous  PONV Risk Score and Plan:   Airway Management Planned: Nasal Cannula  Additional Equipment:   Intra-op Plan:   Post-operative Plan:   Informed Consent: I have reviewed the patients History and Physical, chart, labs and discussed the procedure including the risks, benefits and alternatives for the proposed anesthesia with the patient or authorized representative who has indicated his/her understanding and acceptance.   Dental advisory given  Plan Discussed with: CRNA and Surgeon  Anesthesia Plan Comments:         Anesthesia Quick Evaluation

## 2017-03-22 NOTE — Anesthesia Post-op Follow-up Note (Signed)
Anesthesia QCDR form completed.        

## 2017-03-22 NOTE — Anesthesia Postprocedure Evaluation (Signed)
Anesthesia Post Note  Patient: Elaine HeronKaren G Graves  Procedure(s) Performed: COLONOSCOPY WITH PROPOFOL (N/A )  Patient location during evaluation: PACU Anesthesia Type: General Level of consciousness: awake and alert and oriented Pain management: pain level controlled Vital Signs Assessment: post-procedure vital signs reviewed and stable Respiratory status: spontaneous breathing Cardiovascular status: blood pressure returned to baseline Anesthetic complications: no     Last Vitals:  Vitals:   03/22/17 1130 03/22/17 1140  BP: 98/78 104/62  Pulse: 73 (!) 59  Resp: 13 16  Temp:    SpO2: 100% 100%    Last Pain:  Vitals:   03/22/17 1100  TempSrc: Tympanic                 Kerrie Timm

## 2017-03-22 NOTE — Op Note (Signed)
[facility] Gastroenterology Patient Name: Elaine KinsmanKaren Graves Procedure Date: 03/22/2017 10:25 AM MRN: [id number] Account #: [id number] Date of Birth: 06/11/1955 Admit Type: Outpatient Age: [age] [facility] ROOM 1 Gender: Female Note Status: Finalized Procedure:            Colonoscopy Indications:          Family history of colon cancer in a first-degree                        relative Providers:            Earline MayotteJeffrey W. Lovina Zuver, MD Referring MD:         Jillene Bucksenny C. Arlana Pouchate, MD (Referring MD) Medicines:            Monitored Anesthesia Care Complications:        No immediate complications. Procedure:            Pre-Anesthesia Assessment:                       - Prior to the procedure, a History and Physical was                        performed, and patient medications, allergies and                        sensitivities were reviewed. The patient's tolerance of                        previous anesthesia was reviewed.                       - The risks and benefits of the procedure and the                        sedation options and risks were discussed with the                        patient. All questions were answered and informed                        consent was obtained.                       After obtaining informed consent, the colonoscope was                        passed under direct vision. Throughout the procedure,                        the patient's blood pressure, pulse, and oxygen                        saturations were monitored continuously. The                        Colonoscope was introduced through the anus and                        advanced to the the cecum, identified by appendiceal  orifice and ileocecal valve. The colonoscopy was                        somewhat difficult due to significant looping and a                        tortuous colon. Successful completion of the procedure                        was aided by changing the  patient to a prone position.                        The patient tolerated the procedure well. The quality                        of the bowel preparation was good. Findings:      A few medium-mouthed diverticula were found in the sigmoid colon.      The retroflexed view of the distal rectum and anal verge was normal and       showed no anal or rectal abnormalities. Impression:           - Diverticulosis in the sigmoid colon.                       - The distal rectum and anal verge are normal on                        retroflexion view.                       - No specimens collected. Recommendation:       - Repeat colonoscopy in 5 years for surveillance. Procedure Code(s):    --- Professional ---                       [phone], Colonoscopy, flexible; diagnostic, including                        collection of specimen(s) by brushing or washing, when                        performed (separate procedure) Diagnosis Code(s):    --- Professional ---                       Z80.0, Family history of malignant neoplasm of                        digestive organs                       K57.30, Diverticulosis of large intestine without                        perforation or abscess without bleeding CPT copyright 2016 [employer]. All rights reserved. The codes documented in this report are preliminary and upon coder review may  be revised to meet current compliance requirements. Earline Mayotte, MD 03/22/2017 11:06:19 AM This report has been signed electronically. Number of Addenda: 0 Note Initiated On: 03/22/2017 10:25 AM Scope Withdrawal Time: 0 hours 10 minutes 12 seconds  Total Procedure Duration: 0  hours 24 minutes 20 seconds       [facility]

## 2017-03-23 ENCOUNTER — Encounter: Payer: Self-pay | Admitting: General Surgery

## 2017-04-11 ENCOUNTER — Ambulatory Visit
Admission: RE | Admit: 2017-04-11 | Discharge: 2017-04-11 | Disposition: A | Discharge status: Home / Self Care | Payer: BLUE CROSS/BLUE SHIELD | Source: Ambulatory Visit | Attending: Obstetrics and Gynecology | Admitting: Obstetrics and Gynecology

## 2017-04-11 DIAGNOSIS — Z1231 Encounter for screening mammogram for malignant neoplasm of breast: Secondary | ICD-10-CM | POA: Insufficient documentation

## 2017-04-11 DIAGNOSIS — Z1239 Encounter for other screening for malignant neoplasm of breast: Secondary | ICD-10-CM

## 2017-05-09 IMAGING — CR DG SKULL COMPLETE 4+V
1 series · 6 of 6 positions shown · non-contrast
Comparison: None.

CLINICAL DATA: Right parietal lump just posterior to the right ear
for several weeks, no injury, headaches

EXAM:
SKULL - COMPLETE 4 + VIEW

[Series 1: (person_name) · 0.17mm/px · 6 of 6 slices shown]
[im 1/6]
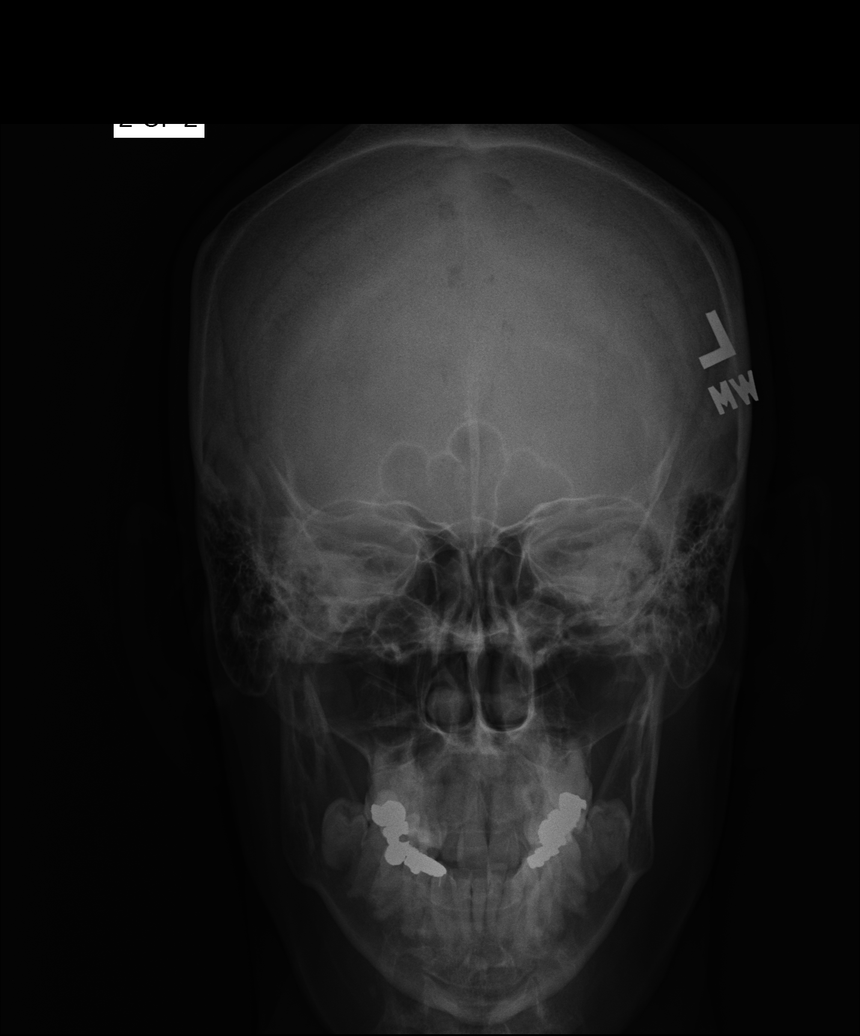
[im 2/6]
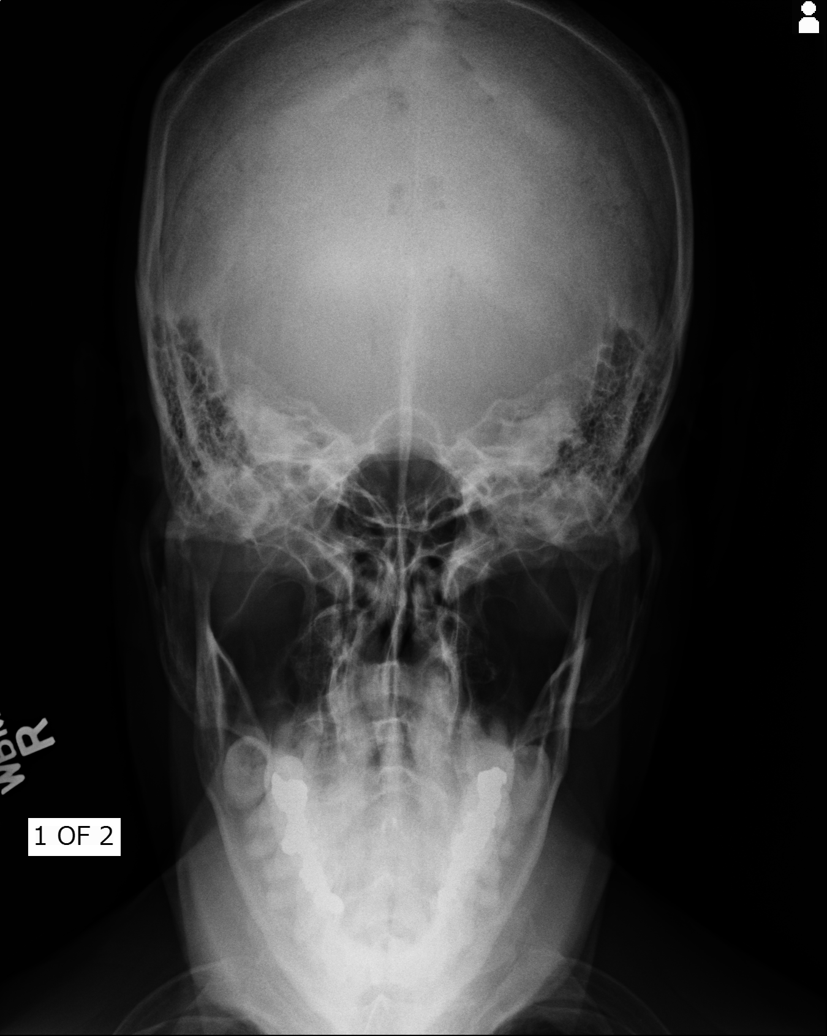
[im 3/6]
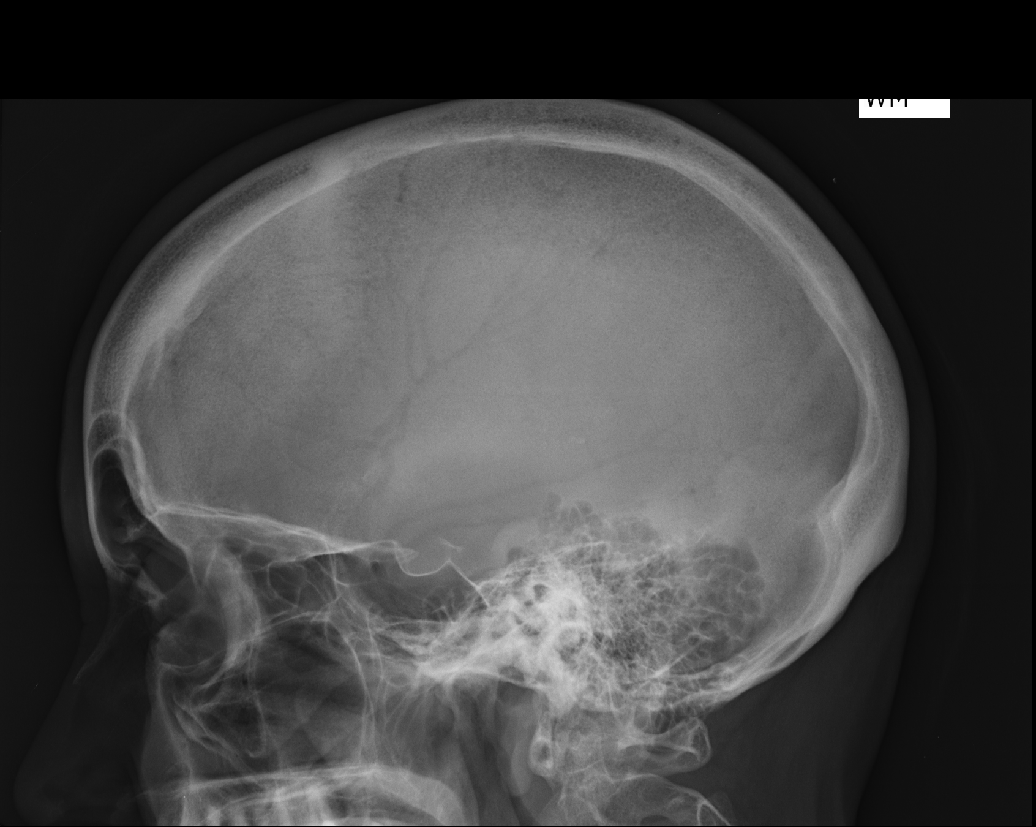
[im 4/6]
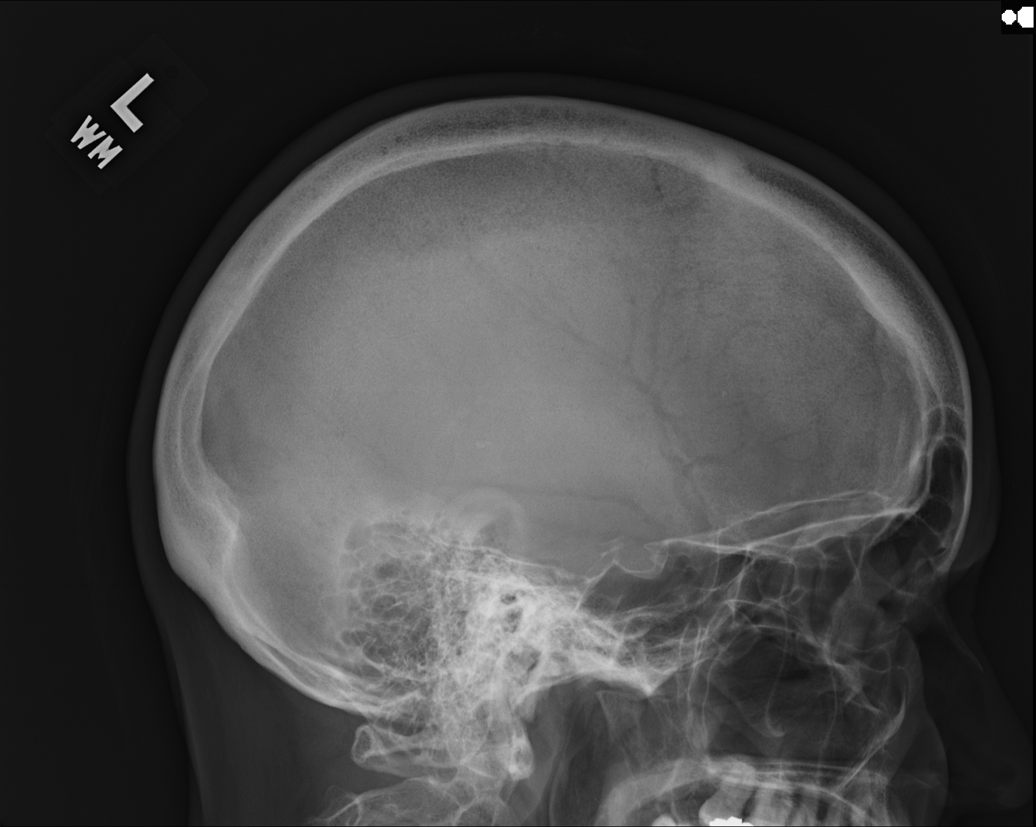
[im 5/6]
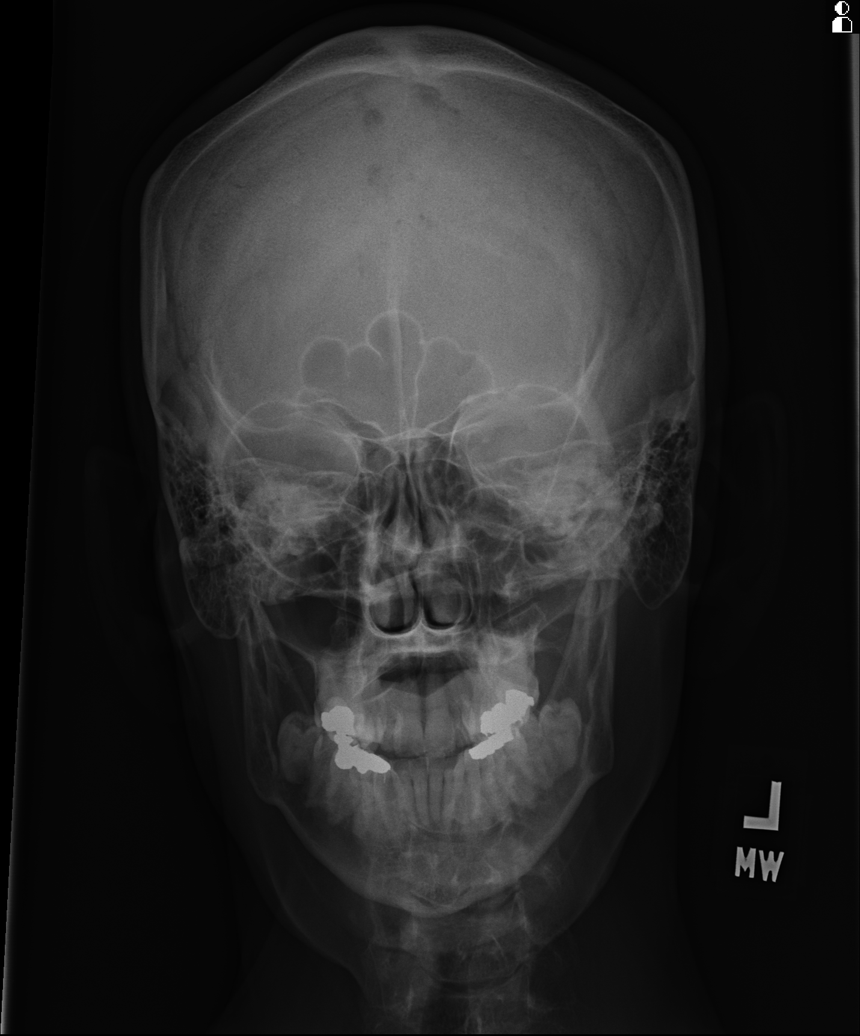
[im 6/6]
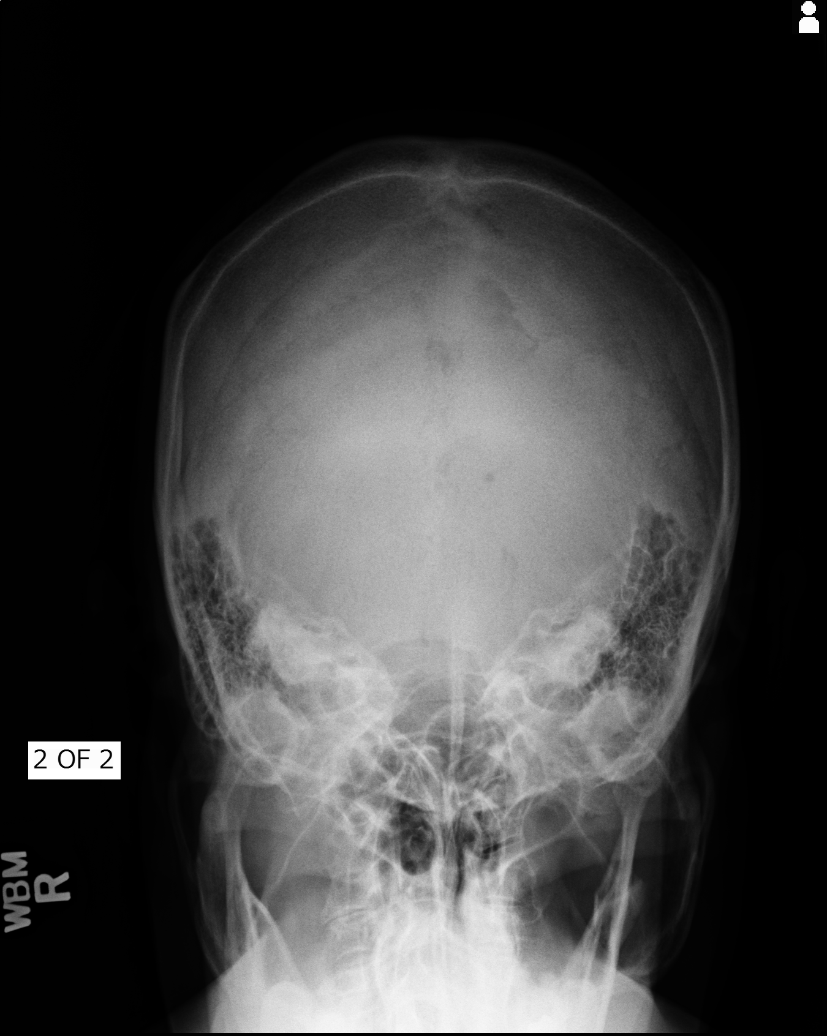

[6 of 6 positions shown; findings below may reference images not displayed]

FINDINGS: No bony protruberance is seen posterior to the right ear by plain
film. No soft tissue prominence is noted on the images obtained. If
necessary CT scan of the brain may be helpful to assess further. The
paranasal sinuses appear clear. No acute bony abnormality is seen.
IMPRESSION: Negative views of the skull. Consider CT of the brain if warranted
clinically..

## 2017-05-22 ENCOUNTER — Encounter: Payer: Self-pay | Admitting: Physical Therapy

## 2017-05-22 ENCOUNTER — Ambulatory Visit: Payer: BLUE CROSS/BLUE SHIELD | Attending: Orthopedic Surgery | Admitting: Physical Therapy

## 2017-05-22 DIAGNOSIS — M62838 Other muscle spasm: Secondary | ICD-10-CM

## 2017-05-22 DIAGNOSIS — M7542 Impingement syndrome of left shoulder: Secondary | ICD-10-CM | POA: Insufficient documentation

## 2017-05-22 NOTE — Therapy (Signed)
[facility] PH[facility]ICINE [address] [place], [place], [address] Phone: [phone]   Fax:  [phone]  Physical Therapy Treatment  Patient Details  Name: Elaine Graves MRN: [id number] Date of Birth: 06/03/1955 Referring Provider: Cassell SmilesJames Bowers, MD   Encounter Date: 05/22/2017  PT End of Session - 05/22/17 0952    Visit Number  1    Number of Visits  16    Date for PT Re-Evaluation  07/03/17    PT Start Time  0802    PT Stop Time  0903    PT Time Calculation (min)  61 min    Activity Tolerance  Patient tolerated treatment well    Behavior During Therapy  [facility] for tasks assessed/performed;Agitated       Past Medical History:  Diagnosis Date  . Anal fissure   . Unspecified hemorrhoids without mention of complication 2012    Past Surgical History:  Procedure Laterality Date  . BUNIONECTOMY Right   . COLONOSCOPY  2010,01/28/2012  . COLONOSCOPY WITH PROPOFOL N/A 03/22/2017   Procedure: COLONOSCOPY WITH PROPOFOL;  Surgeon: Earline MayotteByrnett, Jeffrey W, MD;  Location: [facility] ENDOSCOPY;  Service: Endoscopy;  Laterality: N/A;  . HEMORRHOIDECTOMY WITH HEMORRHOID BANDING  2004  . ROTATOR CUFF REPAIR  2006    There were no vitals filed for this visit.  Subjective Assessment - 05/22/17 0813    Subjective  L shoulder impingement     Pertinent History  Pt is a 62 year old female with L shoulder pain that has gradually increased over the past 6 weeks. She reports pain is the worst on the lateral aspect of the arm, with pins and needles sensation in the L hand (without radiation, localized soley to hand). Patient reports she has had a cortisone injection as well as ibuprofen and predisone muscle relaxer, that she reports she has stopped taken d/t experiencing no pain relief.  She reports she had a surgery 2003 for RTC repair with subacromial decompression. She reports that pain has gradually gotten worse over the past 6 weeks. Patient reports no  relief with prescribed pain medicaiton. Worst pain in the past week 5/10 Best 1/10. Pt denies N/V, unexplained weight fluctuation, saddle paresthesia, fever, night sweats, or unrelenting night pain at this time. Initially patient was very aggitated that this therapist had to evaluate her, as she reports [facility] and was unable to recieve further appointments, and did not want to "re-do" the evaluation procress. Therapist apologized for inconvinence and educated patient on practice guidelines and laws.      Limitations  Sitting;Lifting;House hold activities    How long can you sit comfortably?  5 min with driving    How long can you stand comfortably?  unlimited    How long can you walk comfortably?  unlimited    Diagnostic tests  [facility] which was indicitive of bone spurring at prox humerus    Patient Stated Goals  reduce pain to complete ADLs    Currently in Pain?  Yes    Pain Score  3     Pain Location  Shoulder    Pain Orientation  Left    Pain Descriptors / Indicators  Aching;Pins and needles    Pain Type  Chronic pain;Neuropathic pain    Pain Radiating Towards  L hand down lateral aspect of shoulder    Pain Onset  More than a month ago    Pain Frequency  Constant  Aggravating Factors   vaccumming, driving, using phone at work, sleeping    Pain Relieving Factors  none (reports no pain relief with ice/heat or pain medication)       AROM All bilat shoulder motions wnl with pain with L ER and flex at end range All cervical motions wnl with flex causing "pulling sensation" at L UT and pulling sensation at bilat UT with opposite lateral bending  PROM Shoulder flex L134d w/ pain R- Shoulder ext  Shoulder abd L 121d with pain R- Shoulder IR L 99d w/o pain R- Shoulder ER L 36d w/ pain R- All passive cervical motions wnl *All R shoulder PROM wnl w/o pain *muscle guarding with all passive L  shoulder motion   Strength Shoulder flex L: 4+/5 with  "ache" R: 4+/5 Shoulder ext L: 4/5 with ache at tricep R: 4+/5 Shoulder abd L: 4/5 with "ache" R: 4+/5 Shoulder IR L: 3+/5 with ache at medial aspect of upper arm R: 4+/5 Shoulder ER L: 3+/5 with ache at medial aspect of upper arm R: 4+/5 Elbow Flex L 3+/5 w/ pain at bicep muscle belly and prox insertion R 55 Elbow Ext L 4/5 w/ pain R 5/5 Grip Strength: 5/5 bilat  Test the following next visit: (unable d/t muscle gaurding Rhomboid L- R- Middle Trap L- R- Lower Trap L- R- Supine deep cervical flexor chin tuck + lift can hold for secs   Special Tests/Other UE bicep/ tricep Reflexes 2+ bilat (-) Spurlings (+) Leanord Asal (+) Neers (+) Painful Arc @ 90d (+) Empty can  (+) ER lag sign (+) Lift off test (-) Speeds (+) Yergason's  Palpation: TTP at L bicep muscle belly and prox insertion, tricep muscle belly and prox insertion, supraspinatus lateral insertion, UT, pec minor esp at lateral insetion; slight TTP at L and R pec minor muscle belly and at L scap stabilizers. Patient demonstrating muscle guarding, as well as trigger points in these areas.  Posture FHRS upper crossed posture in sitting and standing                       PT Education - 05/22/17 0951    Education provided  Yes    Education Details   Patient was educated on diagnosis, anatomy and pathology involved, prognosis, role of PT, and was given an HEP, demonstrating exercise with proper form following verbal and tactile cues, and was given a paper hand out to continue exercise at home. Pt was educated on and agreed to plan of care.    Person(s) Educated  Patient    Methods  Explanation;Demonstration;Tactile cues;Verbal cues;Handout    Comprehension  Verbal cues required;Returned demonstration;Tactile cues required;Verbalized understanding       PT Short Term Goals - 05/22/17 1011      PT SHORT TERM GOAL #1   Title  Pt will be independent with HEP in order to improve strength and ROM in order  to  improve function at home and work.    Time  2    Period  Weeks    Status  New        PT Long Term Goals - 05/22/17 1011      PT LONG TERM GOAL #1   Title  Patient will increase FOTO score to 70 to demonstrate predicted increase in functional mobility to complete ADLs    Baseline  05/22/17 54    Time  6    Period  Weeks    Status  New      PT LONG TERM GOAL #2   Title  Pt will decrease quick DASH score by at least 8% in order to demonstrate clinically significant reduction in disability    Baseline  3/18 36.4%    Time  6    Period  Weeks    Status  New      PT LONG TERM GOAL #3   Title  Patient will demonstrate symmetrical bilat 4+/5 gross shoulder strength in order to care for large dog without pain    Baseline  3/18 see eval    Time  6    Period  Weeks    Status  New      PT LONG TERM GOAL #4   Title  Pt will decrease worst pain as reported on NPRS by at least 3 points in order to demonstrate clinically significant reduction in pain.    Baseline  3/18 Worst pain 5/10    Time  6    Period  Weeks    Status  New      PT LONG TERM GOAL #5   Title  Patient will be able to correct posture to neutral with 100% accuracy without cuing from PT to demonstrate use of correct ergonomics at work    Baseline  3/18 FHRS upper crossed posture in sitting and standing    Time  6    Period  Weeks    Status  New            Plan - 05/22/17 1003    Clinical Impression Statement  Pt is an 62 year old female c/o R shoulder pain with clinical exam consistent with subacromial impingement syndrome, with severe muscle guarding of cervical and shoulder musculature. Current activity limitations in [place] activity, reaching, lifting, self-care ADLs e.g. dressing, grooming, bathing. Associated with impairments including postural dysfunction FHRS/upper crossed presentation, decreased A/PROM, decreased strength RTC/prime movers and trigger points/tightness in bilat UT, pec, and L bicep, tricep, and  supraspinatous. Patient is unable to participate in fully in her role at the [employer], as well as take care of her new dog (larger Lab), without pain. Pt will benefit from skilled PT intervention to address the aforementioned impairments and activity limitations for best return to PLOF.    History and Personal Factors relevant to plan of care:  2 personal factors/comorbidities, 3 body systems/activity limitations/participation restrictions     Clinical Presentation  Evolving    Clinical Presentation due to:  Objective tests and measures; extreme muscle gaurding    Clinical Decision Making  Moderate    Rehab Potential  Good    Clinical Impairments Affecting Rehab Potential  (+) motivation, fairly acute nature of sx, active lifestyle (-) severe muscle gaurding, age, prior surgery    PT Frequency  2x / week    PT Duration  6 weeks    PT Treatment/Interventions  ADLs/Self Care Home Management;Electrical Stimulation;Therapeutic activities;Patient/family education;Manual techniques;Dry needling;Therapeutic exercise;Moist Heat;Traction;Ultrasound;Cryotherapy;Neuromuscular re-education;Passive range of motion;Taping    PT Next Visit Plan  HEP review, postural restoration, manual techniques to reduce tension in bicep/tricep/UT/RTC muscles, test postural scap retractor strength    PT Home Exercise Plan  doorway pec stretch; Seated UT and levator stretch, tricep stretch.     Consulted and Agree with Plan of Care  Patient       Patient will benefit from skilled therapeutic intervention in order to improve the following deficits and impairments:  Decreased range of motion, Impaired tone, Increased muscle spasms, Hypomobility,  Decreased activity tolerance, Decreased strength, Increased fascial restricitons, Impaired flexibility, Impaired UE functional use, Postural dysfunction, Pain  Visit Diagnosis: Rotator cuff impingement syndrome, left  Other muscle spasm     Problem List Patient Active Problem  List   Diagnosis Date Noted  . Family history of colon cancer in mother 03/14/2017  . Psychophysiological insomnia 02/22/2017  . Postmenopausal bleeding 11/01/2016  . Lichen sclerosus 12/30/2015  . Hot flashes, menopausal 02/11/2015  . 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 [place], [address] Phone: [phone]   Fax:  [phone]  Name: BRIELLE MORO MRN: [id number] Date of Birth: 1955/12/09

## 2017-05-25 ENCOUNTER — Encounter: Payer: Self-pay | Admitting: Physical Therapy

## 2017-05-25 ENCOUNTER — Ambulatory Visit: Payer: BLUE CROSS/BLUE SHIELD | Admitting: Physical Therapy

## 2017-05-25 DIAGNOSIS — M62838 Other muscle spasm: Secondary | ICD-10-CM

## 2017-05-25 DIAGNOSIS — M7542 Impingement syndrome of left shoulder: Secondary | ICD-10-CM

## 2017-05-25 NOTE — Therapy (Signed)
[facility] [address] [place], [place], [address] Phone: [phone]   Fax:  [phone]  Physical Therapy Treatment  Patient Details  Name: Elaine HeronKaren G Graves MRN: [id number] Date of Birth: 11/18/1955 Referring Provider: Cassell SmilesJames Bowers, MD   Encounter Date: 05/25/2017  PT End of Session - 05/25/17 1736    Visit Number  2    Number of Visits  16    Date for PT Re-Evaluation  07/17/17    PT Start Time  0500    PT Stop Time  0545    PT Time Calculation (min)  45 min    Activity Tolerance  Patient tolerated treatment well    Behavior During Therapy  [facility] for tasks assessed/performed;Agitated       Past Medical History:  Diagnosis Date  . Anal fissure   . Unspecified hemorrhoids without mention of complication 2012    Past Surgical History:  Procedure Laterality Date  . BUNIONECTOMY Right   . COLONOSCOPY  2010,01/28/2012  . COLONOSCOPY WITH PROPOFOL N/A 03/22/2017   Procedure: COLONOSCOPY WITH PROPOFOL;  Surgeon: Earline MayotteByrnett, Jeffrey W, MD;  Location: [facility] ENDOSCOPY;  Service: Endoscopy;  Laterality: N/A;  . HEMORRHOIDECTOMY WITH HEMORRHOID BANDING  2004  . ROTATOR CUFF REPAIR  2006    There were no vitals filed for this visit.  Subjective Assessment - 05/25/17 1702    Subjective  Patient reports she feels as though her pain is gradually getting better, reporting 2.10 pain today. Patient reports she has some stress in her life that she feels is contributing to her pain. Patient reports that she has been able to do some of her HEP and reports that what she is able to do she feels is helping.     Pertinent History  Pt is a 62 year old female with L shoulder pain that has gradually increased over the past 6 weeks. She reports pain is the worst on the lateral aspect of the arm, with pins and needles sensation in the L hand (without radiation, localized soley to hand). Patient reports she has had a cortisone injection as  well as ibuprofen and predisone muscle relaxer, that she reports she has stopped taken d/t experiencing no pain relief.  She reports she had a surgery 2003 for RTC repair with subacromial decompression. She reports that pain has gradually gotten worse over the past 6 weeks. Patient reports no relief with prescribed pain medicaiton. Worst pain in the past week 5/10 Best 1/10. Pt denies N/V, unexplained weight fluctuation, saddle paresthesia, fever, night sweats, or unrelenting night pain at this time. Initially patient was very aggitated that this therapist had to evaluate her, as she reports [facility] and was unable to recieve further appointments, and did not want to "re-do" the evaluation procress. Therapist apologized for inconvinence and educated patient on practice guidelines and laws.      Limitations  Sitting;Lifting;House hold activities    How long can you sit comfortably?  5 min with driving    How long can you stand comfortably?  unlimited    How long can you walk comfortably?  unlimited    Diagnostic tests  [facility] which was indicitive of bone spurring at prox humerus    Patient Stated Goals  reduce pain to complete ADLs    Pain Onset  More than a month ago       Manual - AP shoulder mobilization grade III 30sec bouts 6 bouts  in neutral; 6 in end range abd; 6 in end range flex - STM and trigger point release to L levator and UT (prolonged time spent on UT) - Cross friction massage to L Prox biceps tendon and pec minor tendon w/ STM, trigger point release to muscle bellies; with patient supine with rolled towel along tspine  Ther-Ex -Patient asked and PT educated on practice of Yoga for tension relief and stress reduction -HEP review: 3x 30sec hold doorway pec stretch, Seated UT stretch 1x 20sec hold, levator stretch 1x 30sec hold  ESTIM + heat [employer] for pain modulation and decreased muscle spasm at a patient tolerated 25V with PT monitoring  intensity and increasing to 26V over treatment time, educating patient on purpose and use of treatment, and monitoring skin integrity following (normal); with patient reporting decreased pain 0/10 with full pain-free shoulder ROM following                      PT Education - 05/25/17 1735    Education provided  Yes    Education Details  ESTIM education, re-inforced postural education    Person(s) Educated  Patient    Methods  Explanation;Demonstration;Tactile cues;Verbal cues    Comprehension  Verbalized understanding;Returned demonstration;Verbal cues required;Tactile cues required       PT Short Term Goals - 05/22/17 1011      PT SHORT TERM GOAL #1   Title  Pt will be independent with HEP in order to improve strength and ROM in order to  improve function at home and work.    Time  2    Period  Weeks    Status  New        PT Long Term Goals - 05/22/17 1011      PT LONG TERM GOAL #1   Title  Patient will increase FOTO score to 70 to demonstrate predicted increase in functional mobility to complete ADLs    Baseline  05/22/17 54    Time  6    Period  Weeks    Status  New      PT LONG TERM GOAL #2   Title  Pt will decrease quick DASH score by at least 8% in order to demonstrate clinically significant reduction in disability    Baseline  3/18 36.4%    Time  6    Period  Weeks    Status  New      PT LONG TERM GOAL #3   Title  Patient will demonstrate symmetrical bilat 4+/5 gross shoulder strength in order to care for large dog without pain    Baseline  3/18 see eval    Time  6    Period  Weeks    Status  New      PT LONG TERM GOAL #4   Title  Pt will decrease worst pain as reported on NPRS by at least 3 points in order to demonstrate clinically significant reduction in pain.    Baseline  3/18 Worst pain 5/10    Time  6    Period  Weeks    Status  New      PT LONG TERM GOAL #5   Title  Patient will be able to correct posture to neutral with 100%  accuracy without cuing from PT to demonstrate use of correct ergonomics at work    Baseline  3/18 FHRS upper crossed posture in sitting and standing    Time  6    Period  Weeks    Status  New            Plan - 05/25/17 1738    Clinical Impression Statement  Pts chief complaint today is muscle spasm around the shoulder, localized to the L UT and levator scapula. At the beginning of session patient had pain at end range flexion, and following manual and ESTIM patient is able to move UE through full ROM and reports no pain. Patient asked and PT educated further on shoulder impingement and postural involvement. Patient was aggitated today reporting she has a lot of personal stress that she believes is making her pain worse, and PT gave patient stress reduction technique options    Clinical Impairments Affecting Rehab Potential  (+) motivation, fairly acute nature of sx, active lifestyle (-) severe muscle gaurding, age, prior surgery    PT Frequency  2x / week    PT Duration  6 weeks    PT Treatment/Interventions  ADLs/Self Care Home Management;Electrical Stimulation;Therapeutic activities;Patient/family education;Manual techniques;Dry needling;Therapeutic exercise;Moist Heat;Traction;Ultrasound;Cryotherapy;Neuromuscular re-education;Passive range of motion;Taping    PT Next Visit Plan  Assess effectiveness of ESTIM + heat treatment, postural restoration, manual techniques to reduce tension in bicep/tricep/UT/RTC muscles, test postural scap retractor strength    PT Home Exercise Plan  doorway pec stretch; Seated UT and levator stretch, tricep stretch.     Consulted and Agree with Plan of Care  Patient       Patient will benefit from skilled therapeutic intervention in order to improve the following deficits and impairments:  Decreased range of motion, Impaired tone, Increased muscle spasms, Hypomobility, Decreased activity tolerance, Decreased strength, Increased fascial restricitons, Impaired  flexibility, Impaired UE functional use, Postural dysfunction, Pain  Visit Diagnosis: Other muscle spasm  Rotator cuff impingement syndrome, left     Problem List Patient Active Problem List   Diagnosis Date Noted  . Family history of colon cancer in mother 03/14/2017  . Psychophysiological insomnia 02/22/2017  . Postmenopausal bleeding 11/01/2016  . Lichen sclerosus 12/30/2015  . Hot flashes, menopausal 02/11/2015  . 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 PHYSICAL AND SPORTS MEDICINE [address] [place], [address] Phone: [phone]   Fax:  [phone]  Name: Elaine Graves MRN: [id number] Date of Birth: February 26, 1956

## 2017-05-29 ENCOUNTER — Ambulatory Visit: Payer: BLUE CROSS/BLUE SHIELD | Admitting: Physical Therapy

## 2017-05-29 ENCOUNTER — Encounter: Payer: Self-pay | Admitting: Physical Therapy

## 2017-05-29 DIAGNOSIS — M7542 Impingement syndrome of left shoulder: Secondary | ICD-10-CM | POA: Diagnosis not present

## 2017-05-29 DIAGNOSIS — M62838 Other muscle spasm: Secondary | ICD-10-CM

## 2017-05-29 NOTE — Therapy (Signed)
[facility] PHYS[facility] AND SPOR[facility] MEDICINE [address] [place], [place], [address] Phone: [phone]   Fax:  [phone]  Physical Therapy Treatment  Patient Details  Name: Elaine HeronKaren G Graves MRN: [id number] Date of Birth: 05/07/1955 Referring Provider: Cassell SmilesJames Bowers, MD   Encounter Date: 05/29/2017  PT End of Session - 05/29/17 1543    Visit Number  3    Number of Visits  16    Date for PT Re-Evaluation  07/17/17    PT Start Time  0315    PT Stop Time  0400    PT Time Calculation (min)  45 min    Activity Tolerance  Patient tolerated treatment well    Behavior During Therapy  [facility] for [facility] assessed/performed;Agitated       Past Medical History:  Diagnosis Date  . Anal fissure   . Unspecified hemorrhoids without mention of complication 2012    Past Surgical History:  Procedure Laterality Date  . BUNIONECTOMY Right   . COLONOSCOPY  2010,01/28/2012  . COLONOSCOPY WITH PROPOFOL N/A 03/22/2017   Procedure: COLONOSCOPY WITH PROPOFOL;  Surgeon: Earline MayotteByrnett, Jeffrey W, MD;  Location: [facility] ENDOSCOPY;  Service: Endoscopy;  Laterality: N/A;  . HEMORRHOIDECTOMY WITH HEMORRHOID BANDING  2004  . ROTATOR CUFF REPAIR  2006    There were no vitals filed for this visit.  Subjective Assessment - 05/29/17 1519    Subjective  Patient reports her pain is "MUCH" better and she feels as though the ESTIM was very helpful. Patient reports her pain is 1/10 and reports that there are movements she is finding herself being able to do without pain (such as bathing) which she is very excited about    Pertinent History  Pt is a 62 year old female with L shoulder pain that has gradually increased over the past 6 weeks. She reports pain is the worst on the lateral aspect of the arm, with pins and needles sensation in the L hand (without radiation, localized soley to hand). Patient reports she has had a cortisone injection as well as ibuprofen and predisone muscle relaxer, that  she reports she has stopped taken d/t experiencing no pain relief.  She reports she had a surgery 2003 for RTC repair with subacromial decompression. She reports that pain has gradually gotten worse over the past 6 weeks. Patient reports no relief with prescribed pain medicaiton. Worst pain in the past week 5/10 Best 1/10. Pt denies N/V, unexplained weight fluctuation, saddle paresthesia, fever, night sweats, or unrelenting night pain at this time. Initially patient was very aggitated that this therapist had to evaluate her, as she reports [facility] and was unable to recieve further appointments, and did not want to "re-do" the evaluation procress. Therapist apologized for inconvinence and educated patient on practice guidelines and laws.      Limitations  Sitting;Lifting;House hold activities    How long can you sit comfortably?  5 min with driving    How long can you stand comfortably?  unlimited    How long can you walk comfortably?  unlimited    Diagnostic tests  [facility] which was indicitive of bone spurring at prox humerus    Patient Stated Goals  reduce pain to complete ADLs    Pain Onset  More than a month ago          Manual  - STM and trigger point release to L levator and UT (prolonged time spent on UT) - [employer]  friction massage to L Prox biceps tendon and pec minor tendon w/ STM, trigger point release to muscle bellies; with patient supine with rolled towel along tspine - Cross friction massage to L Prox triceps tendon  w/ STM, trigger point release to muscle bellies where patient reported pain during palpation and pain relief following manual techniques to area   ESTIM + heat HiVolt for pain modulation and decreased muscle spasm at a patient tolerated 70V with PT monitoring intensity and increasing to 80V over treatment time, educating patient on purpose and use of treatment, and monitoring skin integrity following (normal); with patient reporting  decreased pain 0/10 with full pain-free shoulder ROM following   Ther-Ex -Tricep stretch 3x 30sec (added to HEP)      No data recorded                 PT Short Term Goals - 05/22/17 1011      PT SHORT TERM GOAL #1   Title  Pt will be independent with HEP in order to improve strength and ROM in order to  improve function at home and work.    Time  2    Period  Weeks    Status  New        PT Long Term Goals - 05/22/17 1011      PT LONG TERM GOAL #1   Title  Patient will increase FOTO score to 70 to demonstrate predicted increase in functional mobility to complete ADLs    Baseline  05/22/17 54    Time  6    Period  Weeks    Status  New      PT LONG TERM GOAL #2   Title  Pt will decrease quick DASH score by at least 8% in order to demonstrate clinically significant reduction in disability    Baseline  3/18 36.4%    Time  6    Period  Weeks    Status  New      PT LONG TERM GOAL #3   Title  Patient will demonstrate symmetrical bilat 4+/5 gross shoulder strength in order to care for large dog without pain    Baseline  3/18 see eval    Time  6    Period  Weeks    Status  New      PT LONG TERM GOAL #4   Title  Pt will decrease worst pain as reported on NPRS by at least 3 points in order to demonstrate clinically significant reduction in pain.    Baseline  3/18 Worst pain 5/10    Time  6    Period  Weeks    Status  New      PT LONG TERM GOAL #5   Title  Patient will be able to correct posture to neutral with 100% accuracy without cuing from PT to demonstrate use of correct ergonomics at work    Baseline  3/18 FHRS upper crossed posture in sitting and standing    Time  6    Period  Weeks    Status  New            Plan - 05/29/17 1544    Clinical Impression Statement  Pt reports that her pain is practically subsided at this point and that she believes she may not need PT services past this week if she continues to have such drastic pain relief, PT  agrees that if patient is no longer having symptoms she can maintain  HEP she may cease PT if no deficits are present. Patient reports only 1-2/10 pain today, reporting that she had drastic relief from ESTIM + heat treatment. PT continued ESTIM treatment today with patient reporting 0/10 pain following. Patient encouraged to [facility] HEP to maintain modality pain decrease; adding in tricep stretch.   (Pended)     Rehab Potential  Good  (Pended)     Clinical Impairments Affecting Rehab Potential  (+) motivation, fairly acute nature of sx, active lifestyle (-) severe muscle gaurding, age, prior surgery  (Pended)     PT Frequency  2x / week  (Pended)     PT Duration  6 weeks  (Pended)     PT Treatment/Interventions  ADLs/Self Care Home Management;Electrical Stimulation;Therapeutic activities;Patient/family education;Manual techniques;Dry needling;Therapeutic exercise;Moist Heat;Traction;Ultrasound;Cryotherapy;Neuromuscular re-education;Passive range of motion;Taping  (Pended)     PT Next Visit Plan  Assess effectiveness of ESTIM + heat treatment, postural restoration, manual techniques to reduce tension in bicep/tricep/UT/RTC muscles, test postural scap retractor strength  (Pended)        Patient will benefit from skilled therapeutic intervention in order to improve the following deficits and impairments:  (P) Decreased range of motion, Impaired tone, Increased muscle spasms, Hypomobility, Decreased activity tolerance, Decreased strength, Increased fascial restricitons, Impaired flexibility, Impaired UE functional use, Postural dysfunction, Pain  Visit Diagnosis: Other muscle spasm  Rotator cuff impingement syndrome, left     Problem List Patient Active Problem List   Diagnosis Date Noted  . Family history of colon cancer in mother 03/14/2017  . Psychophysiological insomnia 02/22/2017  . Postmenopausal bleeding 11/01/2016  . Lichen sclerosus 12/30/2015  . Hot flashes, menopausal 02/11/2015  .  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 [place], [address] Phone: [phone]   Fax:  [phone]  Name: MYCHAL DURIO MRN: [id number] Date of Birth: [birth year]

## 2017-05-31 ENCOUNTER — Ambulatory Visit: Payer: BLUE CROSS/BLUE SHIELD | Admitting: Physical Therapy

## 2017-06-05 ENCOUNTER — Ambulatory Visit: Payer: BLUE CROSS/BLUE SHIELD | Attending: Orthopedic Surgery | Admitting: Physical Therapy

## 2017-06-05 DIAGNOSIS — M62838 Other muscle spasm: Secondary | ICD-10-CM | POA: Insufficient documentation

## 2017-06-08 ENCOUNTER — Encounter: Payer: Self-pay | Admitting: Physical Therapy

## 2017-06-08 ENCOUNTER — Ambulatory Visit: Payer: BLUE CROSS/BLUE SHIELD | Admitting: Physical Therapy

## 2017-06-08 DIAGNOSIS — M62838 Other muscle spasm: Secondary | ICD-10-CM | POA: Diagnosis present

## 2017-06-08 NOTE — Therapy (Signed)
[place] PHYSICAL AND SPORTS MEDICINE [address], [place], [address] Phone: [phone]   Fax:  [phone]  Physical Therapy Treatment/Discharge  Patient Details  Name: Elaine Graves MRN: [id number] Date of Birth: [birth year] Referring Provider: Kurtis Bushman, MD   Encounter Date: 06/08/2017  PT End of Session - 06/08/17 1336    Visit Number  4    Number of Visits  16    Date for PT Re-Evaluation  07/17/17    PT Start Time  0100    PT Stop Time  0145    PT Time Calculation (min)  45 min    Activity Tolerance  Patient tolerated treatment well    Behavior During Therapy  [facility] for tasks assessed/performed;Agitated       Past Medical History:  Diagnosis Date  . Anal fissure   . Unspecified hemorrhoids without mention of complication 0388    Past Surgical History:  Procedure Laterality Date  . BUNIONECTOMY Right   . COLONOSCOPY  2010,01/28/2012  . COLONOSCOPY WITH PROPOFOL N/A 03/22/2017   Procedure: COLONOSCOPY WITH PROPOFOL;  Surgeon: Robert Bellow, MD;  Location: [facility] ENDOSCOPY;  Service: Endoscopy;  Laterality: N/A;  . HEMORRHOIDECTOMY WITH HEMORRHOID BANDING  2004  . ROTATOR CUFF REPAIR  2006    There were no vitals filed for this visit.  Subjective Assessment - 06/08/17 1305    Subjective  Patient reports her pain is 1-2/10 in anterior shoulder. Patient reports she is feeling much better and believes she can manage her pain at home.     Pertinent History  Pt is a 62 year old female with L shoulder pain that has gradually increased over the past 6 weeks. She reports pain is the worst on the lateral aspect of the arm, with pins and needles sensation in the L hand (without radiation, localized soley to hand). Patient reports she has had a cortisone injection as well as ibuprofen and predisone muscle relaxer, that she reports she has stopped taken d/t experiencing no pain relief.  She reports she had a surgery 2003 for RTC  repair with subacromial decompression. She reports that pain has gradually gotten worse over the past 6 weeks. Patient reports no relief with prescribed pain medicaiton. Worst pain in the past week 5/10 Best 1/10. Pt denies N/V, unexplained weight fluctuation, saddle paresthesia, fever, night sweats, or unrelenting night pain at this time. Initially patient was very aggitated that this therapist had to evaluate her, as she reports she was evaluated at [facility] and was unable to recieve further appointments, and did not want to "re-do" the evaluation procress. Therapist apologized for inconvinence and educated patient on practice guidelines and laws.      Limitations  Sitting;Lifting;House hold activities    How long can you sit comfortably?  5 min with driving    How long can you stand comfortably?  unlimited    How long can you walk comfortably?  unlimited    Diagnostic tests  Xray at [facility] which was indicitive of bone spurring at prox humerus    Patient Stated Goals  reduce pain to complete ADLs    Pain Onset  More than a month ago         Manual  - STM and trigger point release to L levator and UT(discontinued d/t decreased tension and trigger points here). Increased time spent on bicep muscle belly and ant deltoid muscle as these muscles had increased trigger points and is chief pain compliant  with palpation today - Cross friction massage to L Prox biceps tendon and pec minor tendon   Ther-Ex -PT reviewed HEP with importance of continuing stretching to maintain muscle lengthening gained through PT intervention with d/c recommendations of graded exercise approach (as patient wants to begin resistance training again), utilization of cryotherapy and heat therapy as needed for pain control. Postural restoration education and the importance of continuing proper posture as well to aid in maintaining gains made in PT   ESTIM + heatHiVolt for pain modulation and decreased muscle spasm at  bicep/ant and mid delt, at a patient tolerated 100V with PT monitoring intensity and increasing to 80V over treatment time, educating patient on purpose and use of treatment, and monitoring skin integrity following (normal); with patient reporting decreased pain 0/10 with full pain-free shoulder ROM following                         PT Education - 06/08/17 1335    Education provided  Yes    Education Details  D/C recommendations for re-starting resistance training, HEP maintenance (stretching and neutral posture) for continuing muscle lengthening gained through PT intervention     Person(s) Educated  Patient    Methods  Explanation;Demonstration;Tactile cues;Verbal cues    Comprehension  Verbal cues required;Returned demonstration;Tactile cues required;Verbalized understanding       PT Short Term Goals - 05/22/17 1011      PT SHORT TERM GOAL #1   Title  Pt will be independent with HEP in order to improve strength and ROM in order to  improve function at home and work.    Time  2    Period  Weeks    Status  New        PT Long Term Goals - 06/08/17 1306      PT LONG TERM GOAL #1   Title  Patient will increase FOTO score to 70 to demonstrate predicted increase in functional mobility to complete ADLs    Baseline  4/4 77    Status  Achieved      PT LONG TERM GOAL #2   Title  Pt will decrease quick DASH score by at least 8% in order to demonstrate clinically significant reduction in disability    Baseline  4/4 2.3%    Status  Achieved      PT LONG TERM GOAL #3   Title  Patient will demonstrate symmetrical bilat 4+/5 gross shoulder strength in order to care for large dog without pain    Baseline  4/4 5/5 MMT in all motion EXCEPT flexion and ER 4+/5    Time  6    Period  Weeks    Status  Achieved      PT LONG TERM GOAL #4   Title  Pt will decrease worst pain as reported on NPRS by at least 3 points in order to demonstrate clinically significant reduction in  pain.    Baseline  4/4 Pain between 1-2/10    Time  6    Status  Achieved      PT LONG TERM GOAL #5   Title  Patient will be able to correct posture to neutral with 100% accuracy without cuing from PT to demonstrate use of correct ergonomics at work    Baseline  4/4 Patient able to self correct and sustain neutral posture with 100% accuracy without cuing    Time  6    Period  Weeks  Status  Achieved            Plan - 06/08/17 1339    Clinical Impression Statement  At this time patient has met all goals and would like to continue maintenance at home. Patient wanted recommendations for continuing pain management at home and for preventing further pain of this nature. PT educated patient on maintaining proper posture for proper length tension relationship and to prevent shoulder impingement (and following symptoms), continuing stretching for maintenance of muscle lengthening achieved through PT, and on graded exercise approach as she wants to begin going to the gym and doing UE resistance training again. Pt was advised should she need PT services agian to call clinic as her cert is good until 2/97    Clinical Impairments Affecting Rehab Potential  (+) motivation, fairly acute nature of sx, active lifestyle (-) severe muscle gaurding, age, prior surgery    PT Frequency  2x / week    PT Duration  6 weeks    PT Treatment/Interventions  ADLs/Self Care Home Management;Electrical Stimulation;Therapeutic activities;Patient/family education;Manual techniques;Dry needling;Therapeutic exercise;Moist Heat;Traction;Ultrasound;Cryotherapy;Neuromuscular re-education;Passive range of motion;Taping    PT Next Visit Plan  Assess effectiveness of ESTIM + heat treatment, postural restoration, manual techniques to reduce tension in bicep/tricep/UT/RTC muscles, test postural scap retractor strength    PT Home Exercise Plan  doorway pec stretch; Seated UT and levator stretch, tricep stretch.     Consulted and  Agree with Plan of Care  Patient       Patient will benefit from skilled therapeutic intervention in order to improve the following deficits and impairments:  Decreased range of motion, Impaired tone, Increased muscle spasms, Hypomobility, Decreased activity tolerance, Decreased strength, Increased fascial restricitons, Impaired flexibility, Impaired UE functional use, Postural dysfunction, Pain  Visit Diagnosis: Other muscle spasm     Problem List Patient Active Problem List   Diagnosis Date Noted  . Family history of colon cancer in mother 03/14/2017  . Psychophysiological insomnia 02/22/2017  . Postmenopausal bleeding 11/01/2016  . Lichen sclerosus 98/92/1194  . Hot flashes, menopausal 02/11/2015  . 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, [place], [address] Phone: [phone]   Fax:  [phone]  Name: Elaine Graves MRN: [id number] Date of Birth: Aug 22, 1955

## 2017-06-13 ENCOUNTER — Ambulatory Visit: Payer: BLUE CROSS/BLUE SHIELD | Admitting: Physical Therapy

## 2017-06-16 ENCOUNTER — Ambulatory Visit: Payer: BLUE CROSS/BLUE SHIELD | Admitting: Physical Therapy

## 2017-06-20 ENCOUNTER — Encounter: Payer: BLUE CROSS/BLUE SHIELD | Admitting: Physical Therapy

## 2017-06-22 ENCOUNTER — Encounter: Payer: BLUE CROSS/BLUE SHIELD | Admitting: Physical Therapy

## 2017-06-23 ENCOUNTER — Encounter: Payer: BLUE CROSS/BLUE SHIELD | Admitting: Physical Therapy

## 2017-06-26 ENCOUNTER — Encounter: Payer: BLUE CROSS/BLUE SHIELD | Admitting: Physical Therapy

## 2017-06-28 ENCOUNTER — Encounter: Payer: BLUE CROSS/BLUE SHIELD | Admitting: Physical Therapy

## 2017-07-03 ENCOUNTER — Encounter: Payer: BLUE CROSS/BLUE SHIELD | Admitting: Physical Therapy

## 2017-07-12 ENCOUNTER — Encounter: Payer: BLUE CROSS/BLUE SHIELD | Admitting: Physical Therapy

## 2017-11-20 ENCOUNTER — Other Ambulatory Visit: Payer: Self-pay | Admitting: Obstetrics and Gynecology

## 2018-03-13 ENCOUNTER — Ambulatory Visit (INDEPENDENT_AMBULATORY_CARE_PROVIDER_SITE_OTHER): Payer: BLUE CROSS/BLUE SHIELD | Admitting: Obstetrics and Gynecology

## 2018-03-13 ENCOUNTER — Encounter: Payer: Self-pay | Admitting: Obstetrics and Gynecology

## 2018-03-13 ENCOUNTER — Encounter: Payer: BLUE CROSS/BLUE SHIELD | Admitting: Obstetrics and Gynecology

## 2018-03-13 VITALS — BP 132/78 | HR 81 | Ht 66.0 in | Wt 189.5 lb

## 2018-03-13 DIAGNOSIS — Z1239 Encounter for other screening for malignant neoplasm of breast: Secondary | ICD-10-CM | POA: Diagnosis not present

## 2018-03-13 DIAGNOSIS — Z13220 Encounter for screening for lipoid disorders: Secondary | ICD-10-CM

## 2018-03-13 DIAGNOSIS — Z01419 Encounter for gynecological examination (general) (routine) without abnormal findings: Secondary | ICD-10-CM | POA: Diagnosis not present

## 2018-03-13 MED ORDER — ESTRADIOL 0.1 MG/GM VA CREA: 1.0000 | TOPICAL_CREAM | Freq: Every day | VAGINAL | 3 refills | Status: DC

## 2018-03-13 MED ORDER — CLOBETASOL PROPIONATE 0.05 % EX OINT: 1.0000 "application " | TOPICAL_OINTMENT | Freq: Two times a day (BID) | CUTANEOUS | 6 refills | Status: DC

## 2018-03-13 MED ORDER — CONJ ESTROG-MEDROXYPROGEST ACE 0.625-2.5 MG PO TABS: 1.0000 | ORAL_TABLET | Freq: Every day | ORAL | 4 refills | Status: DC

## 2018-03-13 NOTE — Progress Notes (Signed)
Patient comes in for her annual exam. Patient has no problems or concerns today. Pap due 02/2019. Mammogram due 04/2018. Order placed. Will refill medications today.

## 2018-03-13 NOTE — Progress Notes (Signed)
HPI:      [name] is a 63 y.o. [phone] who LMP was No LMP recorded. Patient is postmenopausal.  Subjective:   She presents today for her annual examination.  She is using Prempro, clobetasol and is currently taking Lexapro for anxiety/depression.  She would like to continue on these medications. She is up-to-date on colonoscopies and has a family history of colon cancer. She says that she is due for her screening lab work. Her mammogram is due in February.    Hx: The following portions of the patient's history were reviewed and updated as appropriate:             She  has a past medical history of Anal fissure and Unspecified hemorrhoids without mention of complication (2012). She does not have any pertinent problems on file. She  has a past surgical history that includes Colonoscopy (2010,01/28/2012); Hemorrhoidectomy with hemorrhoid banding (2004); Rotator cuff repair (2006); Bunionectomy (Right); and Colonoscopy with propofol (N/A, 03/22/2017). Her family history includes Colon cancer in her mother; Prostate cancer in her father. She  reports that she has never smoked. She has never used smokeless tobacco. She reports current alcohol use. She reports that she does not use drugs. She has a current medication list which includes the following prescription(s): clobetasol ointment, estradiol, and estrogen (conjugated)-medroxyprogesterone. She has No Known Allergies.       Review of Systems:  Review of Systems  Constitutional: Denied constitutional symptoms, night sweats, recent illness, fatigue, fever, insomnia and weight loss.  Eyes: Denied eye symptoms, eye pain, photophobia, vision change and visual disturbance.  Ears/Nose/Throat/Neck: Denied ear, nose, throat or neck symptoms, hearing loss, nasal discharge, sinus congestion and sore throat.  Cardiovascular: Denied cardiovascular symptoms, arrhythmia, chest pain/pressure, edema, exercise intolerance, orthopnea and  palpitations.  Respiratory: Denied pulmonary symptoms, asthma, pleuritic pain, productive sputum, cough, dyspnea and wheezing.  Gastrointestinal: Denied, gastro-esophageal reflux, melena, nausea and vomiting.  Genitourinary: Denied genitourinary symptoms including symptomatic vaginal discharge, pelvic relaxation issues, and urinary complaints.  Musculoskeletal: Denied musculoskeletal symptoms, stiffness, swelling, muscle weakness and myalgia.  Dermatologic: Denied dermatology symptoms, rash and scar.  Neurologic: Denied neurology symptoms, dizziness, headache, neck pain and syncope.  Psychiatric: Denied psychiatric symptoms, anxiety and depression.  Endocrine: Denied endocrine symptoms including hot flashes and night sweats.   Meds:   No current outpatient medications on file prior to visit.   No current facility-administered medications on file prior to visit.     Objective:     Vitals:   03/13/18 0838  BP: 132/78  Pulse: 81              Physical examination General NAD, Conversant  HEENT Atraumatic; Op clear with mmm.  Normo-cephalic. Pupils reactive. Anicteric sclerae  Thyroid/Neck Smooth without nodularity or enlargement. Normal ROM.  Neck Supple.  Skin No rashes, lesions or ulceration. Normal palpated skin turgor. No nodularity.  Breasts: No masses or discharge.  Symmetric.  No axillary adenopathy.  Lungs: Clear to auscultation.No rales or wheezes. Normal Respiratory effort, no retractions.  Heart: NSR.  No murmurs or rubs appreciated. No periferal edema  Abdomen: Soft.  Non-tender.  No masses.  No HSM. No hernia  Extremities: Moves all appropriately.  Normal ROM for age. No lymphadenopathy.  Neuro: Oriented to PPT.  Normal mood. Normal affect.     Pelvic:   Vulva: Normal appearance.  No lesions.  Small amount of lichen sclerosis present especially posterior.  Vagina: No lesions or abnormalities noted.  Support: Normal  pelvic support.  Urethra No masses tenderness or  scarring.  Meatus Normal size without lesions or prolapse.  Cervix: Normal appearance.  No lesions.  Anus: Normal exam.  No lesions.  Perineum: Normal exam.  No lesions.        Bimanual   Uterus: Normal size.  Non-tender.  Mobile.  AV.  Adnexae: No masses.  Non-tender to palpation.  Cul-de-sac: Negative for abnormality.      Assessment:    [id number] Patient Active Problem List   Diagnosis Date Noted  . Family history of colon cancer in mother 03/14/2017  . Psychophysiological insomnia 02/22/2017  . Postmenopausal bleeding 11/01/2016  . Lichen sclerosus 12/30/2015  . Hot flashes, menopausal 02/11/2015  . 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 Well woman exam with routine gynecological exam   2. Screening for breast cancer   3. Encounter for screening for lipid disorder        Plan:            1.  Basic Screening Recommendations The basic screening recommendations for asymptomatic women were discussed with the patient during her visit.  The age-appropriate recommendations were discussed with her and the rational for the tests reviewed.  When I am informed by the patient that another primary care physician has previously obtained the age-appropriate tests and they are up-to-date, only outstanding tests are ordered and referrals given as necessary.  Abnormal results of tests will be discussed with her when all of her results are completed. 2.  Patient would like to continue Prempro and clobetasol. 3.  Discussed chromosomal genetic cancer screening in detail-literature given.  Patient will consider this because of her family history of colon cancer.   Orders Orders Placed This Encounter  Procedures  . MM 3D SCREEN BREAST BILATERAL  . Lipid panel  . Glucose, random  . TSH     Meds ordered this encounter  Medications  . estrogen, conjugated,-medroxyprogesterone (PREMPRO) 0.625-2.5 MG tablet    Sig: Take 1 tablet by mouth daily.    Dispense:  28 tablet    Refill:  4  .  clobetasol ointment (TEMOVATE) 0.05 %    Sig: Apply 1 application topically 2 (two) times daily. Apply topically to the vulva once or twice a week    Dispense:  30 g    Refill:  6  . estradiol (ESTRACE) 0.1 MG/GM vaginal cream    Sig: Place 1 Applicatorful vaginally at bedtime.    Dispense:  42.5 g    Refill:  3        F/U  No follow-ups on file.  Elonda Huskyavid J. Delmy Holdren, M.D. 03/13/2018 9:24 AM

## 2018-04-16 ENCOUNTER — Other Ambulatory Visit: Payer: BLUE CROSS/BLUE SHIELD

## 2018-04-16 NOTE — Progress Notes (Signed)
ds

## 2018-04-17 LAB — LIPID PANEL
CHOLESTEROL TOTAL: 188 mg/dL (ref 100–199)
Chol/HDL Ratio: 2.7 ratio (ref 0.0–4.4)
HDL: 69 mg/dL (ref >39)
LDL CALC: 100 mg/dL — AB (ref 0–99)
Triglycerides: 93 mg/dL (ref 0–149)
VLDL CHOLESTEROL CAL: 19 mg/dL (ref 5–40)

## 2018-04-17 LAB — GLUCOSE, RANDOM: Glucose: 94 mg/dL (ref 65–99)

## 2018-04-17 LAB — TSH: TSH: 4.39 u[IU]/mL (ref 0.450–4.500)

## 2018-05-08 ENCOUNTER — Ambulatory Visit
Admission: RE | Admit: 2018-05-08 | Discharge: 2018-05-08 | Disposition: A | Discharge status: Home / Self Care | Payer: BLUE CROSS/BLUE SHIELD | Source: Ambulatory Visit | Attending: Obstetrics and Gynecology | Admitting: Obstetrics and Gynecology

## 2018-05-08 ENCOUNTER — Other Ambulatory Visit: Payer: Self-pay

## 2018-05-08 DIAGNOSIS — Z1231 Encounter for screening mammogram for malignant neoplasm of breast: Secondary | ICD-10-CM | POA: Insufficient documentation

## 2018-05-08 DIAGNOSIS — Z1239 Encounter for other screening for malignant neoplasm of breast: Secondary | ICD-10-CM | POA: Diagnosis present

## 2018-10-01 ENCOUNTER — Telehealth: Payer: Self-pay | Admitting: Obstetrics and Gynecology

## 2018-10-01 ENCOUNTER — Other Ambulatory Visit: Payer: Self-pay | Admitting: Obstetrics and Gynecology

## 2018-10-01 NOTE — Telephone Encounter (Signed)
Pt called no answer LM via voicemail that I had received a medication refill request from the pharmacy and the medication was refilled this morning.

## 2018-10-01 NOTE — Telephone Encounter (Signed)
The patient called and stated that her prescription is out of refills. The patient stated that she needs her prescription estrogen, conjugated,-medroxyprogesterone (PREMPRO) 0.625-2.5 MG tablet [[id number]] sent to her pharmacy. The patient is aware that both MD's are out of office and will return tomorrow 7/28. Pt is requesting Midwife to fill prescription so she does not have to wait, Informed pt that may not be a possibility since Dr. Amalia Hailey wrote the prescription. Please advise.

## 2019-01-26 ENCOUNTER — Other Ambulatory Visit: Payer: Self-pay | Admitting: Obstetrics and Gynecology

## 2019-03-07 ENCOUNTER — Ambulatory Visit: Payer: BLUE CROSS/BLUE SHIELD | Attending: Internal Medicine

## 2019-03-07 DIAGNOSIS — Z20822 Contact with and (suspected) exposure to covid-19: Secondary | ICD-10-CM

## 2019-03-13 LAB — NOVEL CORONAVIRUS, NAA

## 2019-03-15 ENCOUNTER — Encounter: Payer: BLUE CROSS/BLUE SHIELD | Admitting: Obstetrics and Gynecology

## 2019-03-27 ENCOUNTER — Other Ambulatory Visit: Payer: Self-pay | Admitting: Obstetrics and Gynecology

## 2019-04-18 ENCOUNTER — Encounter: Payer: Self-pay | Admitting: Obstetrics and Gynecology

## 2019-05-16 ENCOUNTER — Other Ambulatory Visit (HOSPITAL_COMMUNITY)
Admission: RE | Admit: 2019-05-16 | Discharge: 2019-05-16 | Disposition: A | Discharge status: Home / Self Care | Payer: BLUE CROSS/BLUE SHIELD | Source: Ambulatory Visit | Attending: Obstetrics and Gynecology | Admitting: Obstetrics and Gynecology

## 2019-05-16 ENCOUNTER — Encounter: Payer: Self-pay | Admitting: Obstetrics and Gynecology

## 2019-05-16 ENCOUNTER — Other Ambulatory Visit: Payer: Self-pay

## 2019-05-16 ENCOUNTER — Ambulatory Visit (INDEPENDENT_AMBULATORY_CARE_PROVIDER_SITE_OTHER): Payer: BLUE CROSS/BLUE SHIELD | Admitting: Obstetrics and Gynecology

## 2019-05-16 VITALS — BP 113/77 | HR 76 | Ht 66.0 in | Wt 190.3 lb

## 2019-05-16 DIAGNOSIS — Z01419 Encounter for gynecological examination (general) (routine) without abnormal findings: Secondary | ICD-10-CM | POA: Diagnosis not present

## 2019-05-16 DIAGNOSIS — Z124 Encounter for screening for malignant neoplasm of cervix: Secondary | ICD-10-CM

## 2019-05-16 DIAGNOSIS — Z13220 Encounter for screening for lipoid disorders: Secondary | ICD-10-CM | POA: Diagnosis not present

## 2019-05-16 DIAGNOSIS — L900 Lichen sclerosus et atrophicus: Secondary | ICD-10-CM

## 2019-05-16 DIAGNOSIS — Z1231 Encounter for screening mammogram for malignant neoplasm of breast: Secondary | ICD-10-CM

## 2019-05-16 DIAGNOSIS — R300 Dysuria: Secondary | ICD-10-CM

## 2019-05-16 LAB — POCT URINALYSIS DIPSTICK
Bilirubin, UA: NEGATIVE
Glucose, UA: NEGATIVE
Ketones, UA: NEGATIVE
Nitrite, UA: NEGATIVE
Protein, UA: NEGATIVE
Spec Grav, UA: 1.01 (ref 1.010–1.025)
Urobilinogen, UA: 0.2 E.U./dL
pH, UA: 6.5 (ref 5.0–8.0)

## 2019-05-16 MED ORDER — CLOBETASOL PROPIONATE 0.05 % EX OINT: 1.0000 "application " | TOPICAL_OINTMENT | Freq: Two times a day (BID) | CUTANEOUS | 1 refills | Status: DC

## 2019-05-16 MED ORDER — PREMPRO 0.625-2.5 MG PO TABS: 1.0000 | ORAL_TABLET | Freq: Every day | ORAL | 3 refills | Status: DC

## 2019-05-16 MED ORDER — NITROFURANTOIN MONOHYD MACRO 100 MG PO CAPS: 100.0000 mg | ORAL_CAPSULE | Freq: Two times a day (BID) | ORAL | 1 refills | Status: DC

## 2019-05-16 NOTE — Progress Notes (Signed)
HPI:      [name] is a 64 y.o. [phone] who LMP was No LMP recorded. Patient is postmenopausal.  Subjective:   She presents today for her annual examination.  She has no complaints.  She is excited about getting her Covid vaccine tomorrow.   She continues to use clobetasol for lichen sclerosus and takes Prempro daily.  She has occasional dyspareunia and after a discussion it seems possible that lubrication is somewhat of an issue. Of significant note patient has a history of DES exposure through her mother but has not had issues with abnormal cervix or Pap smears. She complains today of approximately 5-day history of pelvic pressure/dysuria.    Hx: The following portions of the patient's history were reviewed and updated as appropriate:             She  has a past medical history of Anal fissure and Unspecified hemorrhoids without mention of complication (0814). She does not have any pertinent problems on file. She  has a past surgical history that includes Colonoscopy (2010,01/28/2012); Hemorrhoidectomy with hemorrhoid banding (2004); Rotator cuff repair (2006); Bunionectomy (Right); and Colonoscopy with propofol (N/A, 03/22/2017). Her family history includes Colon cancer in her mother; Prostate cancer in her father. She  reports that she has never smoked. She has never used smokeless tobacco. She reports current alcohol use. She reports that she does not use drugs. She has a current medication list which includes the following prescription(s): clobetasol ointment, estradiol, prempro, and nitrofurantoin (macrocrystal-monohydrate). She has No Known Allergies.       Review of Systems:  Review of Systems  Constitutional: Denied constitutional symptoms, night sweats, recent illness, fatigue, fever, insomnia and weight loss.  Eyes: Denied eye symptoms, eye pain, photophobia, vision change and visual disturbance.  Ears/Nose/Throat/Neck: Denied ear, nose, throat or neck symptoms,  hearing loss, nasal discharge, sinus congestion and sore throat.  Cardiovascular: Denied cardiovascular symptoms, arrhythmia, chest pain/pressure, edema, exercise intolerance, orthopnea and palpitations.  Respiratory: Denied pulmonary symptoms, asthma, pleuritic pain, productive sputum, cough, dyspnea and wheezing.  Gastrointestinal: Denied, gastro-esophageal reflux, melena, nausea and vomiting.  Genitourinary: See HPI for additional information.  Musculoskeletal: Denied musculoskeletal symptoms, stiffness, swelling, muscle weakness and myalgia.  Dermatologic: Denied dermatology symptoms, rash and scar.  Neurologic: Denied neurology symptoms, dizziness, headache, neck pain and syncope.  Psychiatric: Denied psychiatric symptoms, anxiety and depression.  Endocrine: Denied endocrine symptoms including hot flashes and night sweats.   Meds:   Current Outpatient Medications on File Prior to Visit  Medication Sig Dispense Refill  . clobetasol ointment (TEMOVATE) 4.81 % Apply 1 application topically 2 (two) times daily. Apply topically to the vulva once or twice a week 30 g 6  . estradiol (ESTRACE) 0.1 MG/GM vaginal cream Place 1 Applicatorful vaginally at bedtime. 42.5 g 3  . PREMPRO 0.625-2.5 MG tablet TAKE 1 TABLET BY MOUTH ONCE DAILY 28 tablet 0   No current facility-administered medications on file prior to visit.    Objective:     Vitals:   05/16/19 0805  BP: 113/77  Pulse: 76              Physical examination General NAD, Conversant  HEENT Atraumatic; Op clear with mmm.  Normo-cephalic. Pupils reactive. Anicteric sclerae  Thyroid/Neck Smooth without nodularity or enlargement. Normal ROM.  Neck Supple.  Skin No rashes, lesions or ulceration. Normal palpated skin turgor. No nodularity.  Breasts: No masses or discharge.  Symmetric.  No axillary adenopathy.  Lungs: Clear to auscultation.No  rales or wheezes. Normal Respiratory effort, no retractions.  Heart: NSR.  No murmurs or rubs  appreciated. No periferal edema  Abdomen: Soft.  Non-tender.  No masses.  No HSM. No hernia  Extremities: Moves all appropriately.  Normal ROM for age. No lymphadenopathy.  Neuro: Oriented to PPT.  Normal mood. Normal affect.     Pelvic:   Vulva: Normal appearance.  No lesions.  Vagina: No lesions or abnormalities noted.  Support: Normal pelvic support.  Urethra No masses tenderness or scarring.  Meatus Normal size without lesions or prolapse.  Cervix: Normal appearance.  No lesions.  Moderate stenosis  Anus: Normal exam.  No lesions.  Perineum: Normal exam.  No lesions.  Very small amount of midline lichen sclerosus noted        Bimanual   Uterus: Normal size.  Non-tender.  Mobile.  AV.  Adnexae: No masses.  Non-tender to palpation.  Cul-de-sac: Negative for abnormality.   Urine dip today reveals leukocytes and moderate blood   Assessment:    G3P3003 Patient Active Problem List   Diagnosis Date Noted  . Family history of colon cancer in mother 03/14/2017  . Psychophysiological insomnia 02/22/2017  . Postmenopausal bleeding 11/01/2016  . Lichen sclerosus 12/30/2015  . Hot flashes, menopausal 02/11/2015  . 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 Dysuria   2. Encounter for screening mammogram for malignant neoplasm of breast   3. Well woman exam with routine gynecological exam   4. Encounter for screening for lipid disorder   5. Lichen sclerosus     UTI confirmed by symptoms and urine dip  Patient doing well with treatment for lichen sclerosus  Doing well on Prempro  Occasional dyspareunia   Plan:            1.  Basic Screening Recommendations The basic screening recommendations for asymptomatic women were discussed with the patient during her visit.  The age-appropriate recommendations were discussed with her and the rational for the tests reviewed.  When I am informed by the patient that another primary care physician has previously obtained the age-appropriate  tests and they are up-to-date, only outstanding tests are ordered and referrals given as necessary.  Abnormal results of tests will be discussed with her when all of her results are completed.  Routine preventative health maintenance measures emphasized: Exercise/Diet/Weight control, Tobacco Warnings, Alcohol/Substance use risks and Stress Management Pap performed-mammogram ordered-lab work ordered, patient will present when fasting 2.  Continue clobetasol twice weekly 3.  Continue Prempro 4.  Discussed lubricant use during intercourse 5.  Macrobid for UTI   Orders Orders Placed This Encounter  Procedures  . MM 3D SCREEN BREAST BILATERAL  . Hemoglobin A1c  . Lipid panel  . TSH  . POCT urinalysis dipstick     Meds ordered this encounter  Medications  . nitrofurantoin, macrocrystal-monohydrate, (MACROBID) 100 MG capsule    Sig: Take 1 capsule (100 mg total) by mouth 2 (two) times daily.    Dispense:  14 capsule    Refill:  1        F/U  Return in about 1 year (around 05/15/2020) for Annual Physical.  Elonda Husky, M.D. 05/16/2019 8:45 AM

## 2019-05-21 ENCOUNTER — Other Ambulatory Visit: Payer: Self-pay

## 2019-05-21 ENCOUNTER — Other Ambulatory Visit: Payer: BLUE CROSS/BLUE SHIELD

## 2019-05-21 LAB — CYTOLOGY - PAP
Comment: NEGATIVE
Diagnosis: NEGATIVE
High risk HPV: NEGATIVE

## 2019-05-22 LAB — HEMOGLOBIN A1C
Est. average glucose Bld gHb Est-mCnc: 100 mg/dL
Hgb A1c MFr Bld: 5.1 % (ref 4.8–5.6)

## 2019-05-22 LAB — LIPID PANEL
Chol/HDL Ratio: 2.5 ratio (ref 0.0–4.4)
Cholesterol, Total: 193 mg/dL (ref 100–199)
HDL: 77 mg/dL (ref >39)
LDL Chol Calc (NIH): 101 mg/dL — ABNORMAL HIGH (ref 0–99)
Triglycerides: 83 mg/dL (ref 0–149)
VLDL Cholesterol Cal: 15 mg/dL (ref 5–40)

## 2019-05-22 LAB — TSH: TSH: 6.65 u[IU]/mL — ABNORMAL HIGH (ref 0.450–4.500)

## 2019-05-23 ENCOUNTER — Ambulatory Visit: Payer: BLUE CROSS/BLUE SHIELD

## 2019-05-27 ENCOUNTER — Ambulatory Visit
Admission: RE | Admit: 2019-05-27 | Discharge: 2019-05-27 | Disposition: A | Discharge status: Home / Self Care | Payer: BLUE CROSS/BLUE SHIELD | Source: Ambulatory Visit | Attending: Obstetrics and Gynecology | Admitting: Obstetrics and Gynecology

## 2019-05-27 ENCOUNTER — Other Ambulatory Visit: Payer: Self-pay

## 2019-05-27 DIAGNOSIS — Z1231 Encounter for screening mammogram for malignant neoplasm of breast: Secondary | ICD-10-CM | POA: Diagnosis present

## 2019-06-21 ENCOUNTER — Ambulatory Visit: Payer: Self-pay | Admitting: *Deleted

## 2019-10-29 ENCOUNTER — Other Ambulatory Visit: Payer: Self-pay

## 2019-10-29 ENCOUNTER — Ambulatory Visit
Admission: RE | Admit: 2019-10-29 | Discharge: 2019-10-29 | Disposition: A | Discharge status: Home / Self Care | Payer: BLUE CROSS/BLUE SHIELD | Source: Ambulatory Visit | Attending: Family Medicine | Admitting: Family Medicine

## 2019-10-29 VITALS — BP 139/78 | HR 86 | Temp 98.6°F | Resp 18 | Ht 66.0 in | Wt 190.3 lb

## 2019-10-29 DIAGNOSIS — Z20822 Contact with and (suspected) exposure to covid-19: Secondary | ICD-10-CM | POA: Diagnosis not present

## 2019-10-29 DIAGNOSIS — Z79890 Hormone replacement therapy (postmenopausal): Secondary | ICD-10-CM | POA: Insufficient documentation

## 2019-10-29 DIAGNOSIS — R519 Headache, unspecified: Secondary | ICD-10-CM | POA: Diagnosis present

## 2019-10-29 DIAGNOSIS — B9789 Other viral agents as the cause of diseases classified elsewhere: Secondary | ICD-10-CM | POA: Insufficient documentation

## 2019-10-29 DIAGNOSIS — J988 Other specified respiratory disorders: Secondary | ICD-10-CM | POA: Insufficient documentation

## 2019-10-29 MED ORDER — IPRATROPIUM BROMIDE 0.06 % NA SOLN: 2.0000 | Freq: Four times a day (QID) | NASAL | 0 refills | Status: DC | PRN

## 2019-10-29 NOTE — ED Triage Notes (Signed)
Patient states her husband was diagnosed with COVID last week. Patient tested at [facility] last Wednesday and she was negative. Patient states she has nasal congestion, sore throat, headache and cough that started 1 week ago.

## 2019-10-29 NOTE — Discharge Instructions (Signed)
Medication as prescribed.  COVID test will be back tomorrow.  Take care  Dr. Adelaine Roppolo  

## 2019-10-29 NOTE — ED Provider Notes (Signed)
[facility]    CSN: [id number] Arrival date & time: 10/29/19  1353  History   Chief Complaint Chief Complaint  Patient presents with  . Appointment  . Nasal Congestion  . Headache   HPI  64 year old female presents with the above complaints.  Patient reports that she has been sick for the past week.  She reports nasal congestion, headache, sore throat, fatigue.  Her husband has recently been diagnosed with COVID-19.  She had a rapid test on Wednesday which was negative.  She had a PCR test on Friday which has not yet returned.  Patient has no fever.  She has been taking Mucinex without resolution.  Patient is concerned about the possibility of COVID-19.  Desires testing today.  No other associated symptoms.  No other complaints.  Past Medical History:  Diagnosis Date  . Anal fissure   . Unspecified hemorrhoids without mention of complication 2012    Patient Active Problem List   Diagnosis Date Noted  . Family history of colon cancer in mother 03/14/2017  . Psychophysiological insomnia 02/22/2017  . Postmenopausal bleeding 11/01/2016  . Lichen sclerosus 12/30/2015  . Hot flashes, menopausal 02/11/2015  . 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 BUNIONECTOMY Right   . COLONOSCOPY  2010,01/28/2012  . COLONOSCOPY WITH PROPOFOL N/A 03/22/2017   Procedure: COLONOSCOPY WITH PROPOFOL;  Surgeon: Earline Mayotte, MD;  Location: [facility] ENDOSCOPY;  Service: Endoscopy;  Laterality: N/A;  . HEMORRHOIDECTOMY WITH HEMORRHOID BANDING  2004  . ROTATOR CUFF REPAIR  2006    OB History    Gravida  3   Para  3   Term  3   Preterm      AB      Living  3     SAB      TAB      Ectopic      Multiple      Live Births  3            Home Medications    Prior to Admission medications   Medication Sig Start Date End Date Taking? Authorizing Provider  estrogen, conjugated,-medroxyprogesterone (PREMPRO)  0.625-2.5 MG tablet Take 1 tablet by mouth daily. 05/16/19  Yes Linzie Collin, MD  ipratropium (ATROVENT) 0.06 % nasal spray Place 2 sprays into both nostrils 4 (four) times daily as needed for rhinitis. 10/29/19   Tommie Sams, DO    Family History Family History  Problem Relation Age of Onset  . Colon cancer Mother   . Prostate cancer Father   . Breast cancer Neg Hx   . Ovarian cancer Neg Hx   . Diabetes Neg Hx   . Heart disease Neg Hx     Social History Social History   Tobacco Use  . Smoking status: Never Smoker  . Smokeless tobacco: Never Used  Vaping Use  . Vaping Use: Never used  Substance Use Topics  . Alcohol use: Yes    Comment: weekend  . Drug use: No     Allergies   Patient has no known allergies.   Review of Systems Review of Systems Per HPI  Physical Exam Triage Vital Signs ED Triage Vitals  Enc Vitals Group     BP 10/29/19 1427 139/78     Pulse Rate 10/29/19 1427 86     Resp 10/29/19 1427 18     Temp 10/29/19 1427 98.6 F (37 C)  Temp Source 10/29/19 1427 Oral     SpO2 10/29/19 1427 100 %     Weight 10/29/19 1425 190 lb 4.1 oz (86.3 kg)     Height 10/29/19 1425 5\' 6"  (1.676 m)     Head Circumference --      Peak Flow --      Pain Score 10/29/19 1425 0     Pain Loc --      Pain Edu? --      Excl. in GC? --    Updated Vital Signs BP 139/78 (BP Location: Right Arm)   Pulse 86   Temp 98.6 F (37 C) (Oral)   Resp 18   Ht 5\' 6"  (1.676 m)   Wt 86.3 kg   SpO2 100%   BMI 30.71 kg/m   Visual Acuity Right Eye Distance:   Left Eye Distance:   Bilateral Distance:    Right Eye Near:   Left Eye Near:    Bilateral Near:     Physical Exam Vitals and nursing note reviewed.  Constitutional:      General: She is not in acute distress.    Appearance: Normal appearance. She is not ill-appearing.  HENT:     Head: Normocephalic and atraumatic.     Mouth/Throat:     Pharynx: Oropharynx is clear. No oropharyngeal exudate or posterior  oropharyngeal erythema.  Eyes:     General:        Right eye: No discharge.        Left eye: No discharge.     Conjunctiva/sclera: Conjunctivae normal.  Cardiovascular:     Rate and Rhythm: Normal rate and regular rhythm.     Heart sounds: No murmur heard.   Pulmonary:     Effort: Pulmonary effort is normal.     Breath sounds: Normal breath sounds. No wheezing, rhonchi or rales.  Neurological:     Mental Status: She is alert.  Psychiatric:        Mood and Affect: Mood normal.        Behavior: Behavior normal.    UC Treatments / Results  Labs (all labs ordered are listed, but only abnormal results are displayed) Labs Reviewed  SARS CORONAVIRUS 2 (TAT 6-24 HRS)    EKG   Radiology No results found.  Procedures Procedures (including critical care time)  Medications Ordered in UC Medications - No data to display  Initial Impression / Assessment and Plan / UC Course  I have reviewed the triage vital signs and the nursing notes.  Pertinent labs & imaging results that were available during my care of the patient were reviewed by me and considered in my medical decision making (see chart for details).    64 year old female presents with a viral respiratory infection.  Possible COVID-19.  Awaiting test results.  Atrovent nasal spray as prescribed.  Work note given.  Final Clinical Impressions(s) / UC Diagnoses   Final diagnoses:  Viral respiratory infection     Discharge Instructions     Medication as prescribed.  COVID test will be back tomorrow.  Take care  Dr.    ED Prescriptions    Medication Sig Dispense Auth. Provider   ipratropium (ATROVENT) 0.06 % nasal spray Place 2 sprays into both nostrils 4 (four) times daily as needed for rhinitis. 15 mL 77, DO     PDMP not reviewed this encounter.   Adriana Simas, Tommie Sams 10/29/19 1525

## 2019-10-30 LAB — SARS CORONAVIRUS 2 (TAT 6-24 HRS): SARS Coronavirus 2: NEGATIVE

## 2020-01-23 ENCOUNTER — Telehealth: Payer: Self-pay

## 2020-01-23 NOTE — Telephone Encounter (Signed)
Patient called in wanting to know if there was a such thing as a topical estrogen gel/cream that she could use.  Could you please advise?

## 2020-01-24 NOTE — Telephone Encounter (Signed)
Would you like to prescribe patient this?

## 2020-01-27 NOTE — Telephone Encounter (Signed)
Patient stated that she is having internal vaginal pain. She seen her dermatologist and they did not see anything. I have scheduled her to come in tomorrow to see Dr. Logan Bores.

## 2020-01-27 NOTE — Telephone Encounter (Signed)
Patient called in stating that she hasnt heard anything back from her provider and was needing this Rx as she was having some painful intercourse and that she and her husband were leaving on a cruise next week and would like to have that for their trip.  Could you please advise?

## 2020-01-28 ENCOUNTER — Ambulatory Visit: Payer: BLUE CROSS/BLUE SHIELD | Admitting: Obstetrics and Gynecology

## 2020-01-28 ENCOUNTER — Encounter: Payer: Self-pay | Admitting: Obstetrics and Gynecology

## 2020-01-28 ENCOUNTER — Other Ambulatory Visit: Payer: Self-pay

## 2020-01-28 ENCOUNTER — Ambulatory Visit (INDEPENDENT_AMBULATORY_CARE_PROVIDER_SITE_OTHER): Payer: BLUE CROSS/BLUE SHIELD | Admitting: Obstetrics and Gynecology

## 2020-01-28 VITALS — BP 119/80 | HR 88 | Ht 66.0 in | Wt 189.9 lb

## 2020-01-28 DIAGNOSIS — N941 Unspecified dyspareunia: Secondary | ICD-10-CM | POA: Diagnosis not present

## 2020-01-28 NOTE — Progress Notes (Signed)
HPI:      [name] is a 64 y.o. [phone] who LMP was No LMP recorded. Patient is postmenopausal.  Subjective:   She presents today because she has had some vaginal burning with intercourse.  She reports that she saw a dermatologist recently and that her lichen sclerosus seems to be under control.  There is some question whether or not she had vaginal atrophy contributing to her dyspareunia.  Patient continues to take Prempro daily.  Reports no vaginal bleeding.  She reports no vaginal discharge no itching. She has used lubrication with intercourse and this helped some but it is not completely be cancer. Her husband has Peyronie's disease and she has some question whether this increase in size and direction has contributed to her pain with intercourse.   Hx: The following portions of the patient's history were reviewed and updated as appropriate:             She  has a past medical history of Anal fissure and Unspecified hemorrhoids without mention of complication (2012). She does not have any pertinent problems on file. She  has a past surgical history that includes Colonoscopy (2010,01/28/2012); Hemorrhoidectomy with hemorrhoid banding (2004); Rotator cuff repair (2006); Bunionectomy (Right); and Colonoscopy with propofol (N/A, 03/22/2017). Her family history includes Colon cancer in her mother; Prostate cancer in her father. She  reports that she has never smoked. She has never used smokeless tobacco. She reports current alcohol use. She reports that she does not use drugs. She has a current medication list which includes the following prescription(s): clobetasol cream and prempro. She has No Known Allergies.       Review of Systems:  Review of Systems  Constitutional: Denied constitutional symptoms, night sweats, recent illness, fatigue, fever, insomnia and weight loss.  Eyes: Denied eye symptoms, eye pain, photophobia, vision change and visual disturbance.  Ears/Nose/Throat/Neck:  Denied ear, nose, throat or neck symptoms, hearing loss, nasal discharge, sinus congestion and sore throat.  Cardiovascular: Denied cardiovascular symptoms, arrhythmia, chest pain/pressure, edema, exercise intolerance, orthopnea and palpitations.  Respiratory: Denied pulmonary symptoms, asthma, pleuritic pain, productive sputum, cough, dyspnea and wheezing.  Gastrointestinal: Denied, gastro-esophageal reflux, melena, nausea and vomiting.  Genitourinary: See HPI for additional information.  Musculoskeletal: Denied musculoskeletal symptoms, stiffness, swelling, muscle weakness and myalgia.  Dermatologic: Denied dermatology symptoms, rash and scar.  Neurologic: Denied neurology symptoms, dizziness, headache, neck pain and syncope.  Psychiatric: Denied psychiatric symptoms, anxiety and depression.  Endocrine: Denied endocrine symptoms including hot flashes and night sweats.   Meds:   Current Outpatient Medications on File Prior to Visit  Medication Sig Dispense Refill  . clobetasol cream (TEMOVATE) 0.05 % Apply 1 application topically 2 (two) times daily.    Marland Kitchen estrogen, conjugated,-medroxyprogesterone (PREMPRO) 0.625-2.5 MG tablet Take 1 tablet by mouth daily. 90 tablet 3   No current facility-administered medications on file prior to visit.          Objective:     Vitals:   01/28/20 1322  BP: 119/80  Pulse: 88   Filed Weights   01/28/20 1322  Weight: 189 lb 14.4 oz (86.1 kg)              Physical examination   Pelvic:   Vulva: Normal appearance.  No lesions.  Small introitus  Vagina: No lesions or abnormalities noted.  Well estrogenized  -interior room without obvious atrophy.  Support: Normal pelvic support.  Urethra No masses tenderness or scarring.  Meatus Normal size without lesions  or prolapse.  Cervix: Normal appearance.  No lesions.  Anus: Normal exam.  No lesions.  Perineum: Normal exam.  No lesions.        Bimanual   Uterus: Normal size.  Non-tender.  Mobile.   AV.  Adnexae: No masses.  Non-tender to palpation.  Cul-de-sac: Negative for abnormality.   Pain could not be elicited by palpation  Assessment:    G3P3003 Patient Active Problem List   Diagnosis Date Noted  . Family history of colon cancer in mother 03/14/2017  . Psychophysiological insomnia 02/22/2017  . Postmenopausal bleeding 11/01/2016  . Lichen sclerosus 12/30/2015  . Hot flashes, menopausal 02/11/2015  . 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 Dyspareunia, female     No obvious evidence of infection or atrophy or lichen sclerosus.  Possibly pain from small introitus.   Plan:            1.  Patient will continue use Prempro.  No benefit from vaginal estrogen  2.  Lubrication with intercourse  3.  Consideration for systematic increase in vaginal introitus using dilators, candles etc. Orders No orders of the defined types were placed in this encounter.   No orders of the defined types were placed in this encounter.     F/U  Return for Annual Physical. I spent 22 minutes involved in the care of this patient preparing to see the patient by obtaining and reviewing her medical history (including labs, imaging tests and prior procedures), documenting clinical information in the electronic health record (EHR), counseling and coordinating care plans, writing and sending prescriptions, ordering tests or procedures and directly communicating with the patient by discussing pertinent items from her history and physical exam as well as detailing my assessment and plan as noted above so that she has an informed understanding.  All of her questions were answered.  Elaine Graves, M.D. 01/28/2020 1:42 PM

## 2020-03-17 ENCOUNTER — Other Ambulatory Visit: Payer: BLUE CROSS/BLUE SHIELD

## 2020-03-17 DIAGNOSIS — Z20822 Contact with and (suspected) exposure to covid-19: Secondary | ICD-10-CM

## 2020-03-19 LAB — SARS-COV-2, NAA 2 DAY TAT

## 2020-03-19 LAB — NOVEL CORONAVIRUS, NAA: SARS-CoV-2, NAA: NOT DETECTED

## 2020-03-26 ENCOUNTER — Telehealth: Payer: Self-pay

## 2020-03-26 NOTE — Telephone Encounter (Signed)
OK to change?

## 2020-03-26 NOTE — Telephone Encounter (Signed)
   Patient called in stating that she is about to turn 65 years old and that she is going to be switching to a supplemental insurance. Patient wants to see if she could switch over to estradiol instead of the [facility] she is currently using due to a $200.00 difference.  Could you please advise?

## 2020-04-01 ENCOUNTER — Other Ambulatory Visit: Payer: Self-pay | Admitting: Surgical

## 2020-04-01 NOTE — Telephone Encounter (Signed)
Patient is going to check on the price of the different medications. She will send me a my chart message when she finds out and I will send in.

## 2020-04-03 ENCOUNTER — Other Ambulatory Visit: Payer: Self-pay | Admitting: Surgical

## 2020-04-03 MED ORDER — MEDROXYPROGESTERONE ACETATE 2.5 MG PO TABS: 2.5000 mg | ORAL_TABLET | Freq: Every day | ORAL | 3 refills | Status: DC

## 2020-04-03 MED ORDER — ESTRADIOL 1 MG PO TABS: 1.0000 mg | ORAL_TABLET | Freq: Every day | ORAL | 3 refills | Status: DC

## 2020-04-03 NOTE — Telephone Encounter (Signed)
Patient sent message through mychart. 

## 2020-05-19 ENCOUNTER — Ambulatory Visit (INDEPENDENT_AMBULATORY_CARE_PROVIDER_SITE_OTHER): Payer: Medicare HMO | Admitting: Obstetrics and Gynecology

## 2020-05-19 ENCOUNTER — Other Ambulatory Visit: Payer: Self-pay

## 2020-05-19 ENCOUNTER — Encounter: Payer: Self-pay | Admitting: Obstetrics and Gynecology

## 2020-05-19 VITALS — BP 132/86 | HR 75 | Ht 66.0 in | Wt 195.6 lb

## 2020-05-19 DIAGNOSIS — Z01419 Encounter for gynecological examination (general) (routine) without abnormal findings: Secondary | ICD-10-CM

## 2020-05-19 DIAGNOSIS — Z1231 Encounter for screening mammogram for malignant neoplasm of breast: Secondary | ICD-10-CM | POA: Diagnosis not present

## 2020-05-19 NOTE — Progress Notes (Signed)
HPI:      [name] is a 65 y.o. [phone] who LMP was No LMP recorded. Patient is postmenopausal.  Subjective:   She presents today for her annual examination.  She states that she occasionally has to get up frequently at night and go to the bathroom.  Does not describe urge or stress incontinence during the day. States that her issues with intercourse have improved and she has no issues at this time. Not fasting for blood work today but would like to come back. 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     Hx: The following portions of the patient's history were reviewed and updated as appropriate:             She  has a past medical history of Anal fissure and Unspecified hemorrhoids without mention of complication (2012). She does not have any pertinent problems on file. She  has a past surgical history that includes Colonoscopy (2010,01/28/2012); Hemorrhoidectomy with hemorrhoid banding (2004); Rotator cuff repair (2006); Bunionectomy (Right); and Colonoscopy with propofol (N/A, 03/22/2017). Her family history includes Colon cancer in her mother; Prostate cancer in her father. She  reports that she has never smoked. She has never used smokeless tobacco. She reports current alcohol use. She reports that she does not use drugs. She has a current medication list which includes the following prescription(s): clobetasol cream, estradiol, and medroxyprogesterone. She has No Known Allergies.       Review of Systems:  Review of Systems  Constitutional: Denied constitutional symptoms, night sweats, recent illness, fatigue, fever, insomnia and weight loss.  Eyes: Denied eye symptoms, eye pain, photophobia, vision change and visual disturbance.  Ears/Nose/Throat/Neck: Denied ear, nose, throat or neck symptoms, hearing loss, nasal discharge, sinus congestion and sore throat.   Cardiovascular: Denied cardiovascular symptoms, arrhythmia, chest pain/pressure, edema, exercise intolerance, orthopnea and palpitations.  Respiratory: Denied pulmonary symptoms, asthma, pleuritic pain, productive sputum, cough, dyspnea and wheezing.  Gastrointestinal: Denied, gastro-esophageal reflux, melena, nausea and vomiting.  Genitourinary: See HPI for additional information.  Musculoskeletal: See HPI for additional information.  Dermatologic: Denied dermatology symptoms, rash and scar.  Neurologic: Denied neurology symptoms, dizziness, headache, neck pain and syncope.  Psychiatric: Denied psychiatric symptoms, anxiety and depression.  Endocrine: Denied endocrine symptoms including hot flashes and night sweats.   Meds:   Current Outpatient Medications on File Prior to Visit  Medication Sig Dispense Refill  . clobetasol cream (TEMOVATE) 0.05 % Apply 1 application topically 2 (two) times daily.    Marland Kitchen estradiol (ESTRACE) 1 MG tablet Take 1 tablet (1 mg total) by mouth daily. 30 tablet 3  . medroxyPROGESTERone (PROVERA) 2.5 MG tablet Take 1 tablet (2.5 mg total) by mouth daily. 30 tablet 3   No current facility-administered medications on file prior to visit.          Objective:     Vitals:   05/19/20 0808  BP: 132/86  Pulse: 75    Filed Weights   05/19/20 0808  Weight: 195 lb 9.6 oz (88.7 kg)              Physical examination General NAD, Conversant  HEENT Atraumatic; Op clear with mmm.  Normo-cephalic. Pupils reactive. Anicteric sclerae  Thyroid/Neck Smooth without nodularity or enlargement. Normal ROM.  Neck Supple.  Skin No rashes, lesions or ulceration. Normal palpated skin turgor. No nodularity.  Breasts: No masses or discharge.  Symmetric.  No axillary adenopathy.  Lungs: Clear to auscultation.No rales or wheezes. Normal Respiratory effort, no retractions.  Heart: NSR.  No murmurs or rubs appreciated. No periferal edema  Abdomen: Soft.  Non-tender.  No masses.   No HSM. No hernia  Extremities: Moves all appropriately.  Normal ROM for age. No lymphadenopathy.  Neuro: Oriented to PPT.  Normal mood. Normal affect.     Pelvic:   Vulva: Normal appearance.  No lesions.  Vagina: No lesions or abnormalities noted.  Small introitus  Support: Normal pelvic support.  Urethra No masses tenderness or scarring.  Meatus Normal size without lesions or prolapse.  Cervix: Normal appearance.  No lesions.  Apparent cervical stenosis  Anus: Normal exam.  No lesions.  Perineum: Normal exam.  No lesions.        Bimanual   Uterus: Normal size.  Non-tender.  Mobile.  AV.  Adnexae: No masses.  Non-tender to palpation.  Cul-de-sac: Negative for abnormality.      Assessment:    [id number] Patient Active Problem List   Diagnosis Date Noted  . Family history of colon cancer in mother 03/14/2017  . Psychophysiological insomnia 02/22/2017  . Postmenopausal bleeding 11/01/2016  . Lichen sclerosus 12/30/2015  . Hot flashes, menopausal 02/11/2015  . 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 Well woman exam with routine gynecological exam   2. Encounter for screening mammogram for malignant neoplasm of breast        Plan:            1.  Basic Screening Recommendations The basic screening recommendations for asymptomatic women were discussed with the patient during her visit.  The age-appropriate recommendations were discussed with her and the rational for the tests reviewed.  When I am informed by the patient that another primary care physician has previously obtained the age-appropriate tests and they are up-to-date, only outstanding tests are ordered and referrals given as necessary.  Abnormal results of tests will be discussed with her when all of her results are completed.  Routine preventative health maintenance measures emphasized: Exercise/Diet/Weight control, Tobacco Warnings, Alcohol/Substance use risks and Stress Management Patient has aged out of Pap smears  (last year normal, normal) Mammogram ordered -blood work ordered when patient is fasting 2.  Continue estrogen and progesterone daily Orders Orders Placed This Encounter  Procedures  . MM 3D SCREEN BREAST BILATERAL  . Hemoglobin A1c  . Lipid panel  . TSH    No orders of the defined types were placed in this encounter.         F/U  No follow-ups on file.  Elonda Husky, M.D. 05/19/2020 8:47 AM

## 2020-05-20 ENCOUNTER — Other Ambulatory Visit: Payer: Medicare HMO

## 2020-05-21 LAB — LIPID PANEL
Chol/HDL Ratio: 2.8 ratio (ref 0.0–4.4)
Cholesterol, Total: 195 mg/dL (ref 100–199)
HDL: 69 mg/dL (ref >39)
LDL Chol Calc (NIH): 111 mg/dL — ABNORMAL HIGH (ref 0–99)
Triglycerides: 83 mg/dL (ref 0–149)
VLDL Cholesterol Cal: 15 mg/dL (ref 5–40)

## 2020-05-21 LAB — HEMOGLOBIN A1C
Est. average glucose Bld gHb Est-mCnc: 100 mg/dL
Hgb A1c MFr Bld: 5.1 % (ref 4.8–5.6)

## 2020-05-21 LAB — TSH: TSH: 4 u[IU]/mL (ref 0.450–4.500)

## 2020-06-08 ENCOUNTER — Ambulatory Visit: Payer: BLUE CROSS/BLUE SHIELD

## 2020-06-15 ENCOUNTER — Ambulatory Visit
Admission: RE | Admit: 2020-06-15 | Discharge: 2020-06-15 | Disposition: A | Discharge status: Home / Self Care | Payer: Medicare HMO | Source: Ambulatory Visit | Attending: Obstetrics and Gynecology | Admitting: Obstetrics and Gynecology

## 2020-06-15 ENCOUNTER — Other Ambulatory Visit: Payer: Self-pay

## 2020-06-15 DIAGNOSIS — Z1231 Encounter for screening mammogram for malignant neoplasm of breast: Secondary | ICD-10-CM | POA: Diagnosis not present

## 2020-06-17 ENCOUNTER — Other Ambulatory Visit: Payer: Self-pay | Admitting: Obstetrics and Gynecology

## 2020-06-17 DIAGNOSIS — R928 Other abnormal and inconclusive findings on diagnostic imaging of breast: Secondary | ICD-10-CM

## 2020-06-17 DIAGNOSIS — R921 Mammographic calcification found on diagnostic imaging of breast: Secondary | ICD-10-CM

## 2020-06-18 ENCOUNTER — Other Ambulatory Visit: Payer: Self-pay

## 2020-06-18 ENCOUNTER — Ambulatory Visit: Payer: Medicare HMO | Admitting: Obstetrics and Gynecology

## 2020-06-18 ENCOUNTER — Encounter: Payer: Self-pay | Admitting: Obstetrics and Gynecology

## 2020-06-18 VITALS — BP 132/88 | HR 84 | Ht 66.0 in | Wt 193.3 lb

## 2020-06-18 DIAGNOSIS — L900 Lichen sclerosus et atrophicus: Secondary | ICD-10-CM | POA: Diagnosis not present

## 2020-06-18 DIAGNOSIS — B9689 Other specified bacterial agents as the cause of diseases classified elsewhere: Secondary | ICD-10-CM | POA: Diagnosis not present

## 2020-06-18 DIAGNOSIS — L292 Pruritus vulvae: Secondary | ICD-10-CM | POA: Diagnosis not present

## 2020-06-18 DIAGNOSIS — N760 Acute vaginitis: Secondary | ICD-10-CM | POA: Diagnosis not present

## 2020-06-18 MED ORDER — METRONIDAZOLE 500 MG PO TABS: 500.0000 mg | ORAL_TABLET | Freq: Two times a day (BID) | ORAL | 0 refills | Status: AC

## 2020-06-18 NOTE — Progress Notes (Signed)
HPI:      [name] is a 65 y.o. [phone] who LMP was No LMP recorded. Patient is postmenopausal.  Subjective:   She presents today because she has had several weeks of vulvar itching which has become worse.  She tried Monistat cream without success.  She does have a history of lichen sclerosus but has not been using her clobetasol on a regular basis.  There was some confusion whether clobetasol was for external or internal use.  She complains of a slight vaginal discharge with odor.    Hx: The following portions of the patient's history were reviewed and updated as appropriate:             She  has a past medical history of Anal fissure and Unspecified hemorrhoids without mention of complication (2012). She does not have any pertinent problems on file. She  has a past surgical history that includes Colonoscopy (2010,01/28/2012); Hemorrhoidectomy with hemorrhoid banding (2004); Rotator cuff repair (2006); Bunionectomy (Right); and Colonoscopy with propofol (N/A, 03/22/2017). Her family history includes Colon cancer in her mother; Prostate cancer in her father. She  reports that she has never smoked. She has never used smokeless tobacco. She reports current alcohol use. She reports that she does not use drugs. She has a current medication list which includes the following prescription(s): metronidazole, clobetasol cream, estradiol, and medroxyprogesterone. She has No Known Allergies.       Review of Systems:  Review of Systems  Constitutional: Denied constitutional symptoms, night sweats, recent illness, fatigue, fever, insomnia and weight loss.  Eyes: Denied eye symptoms, eye pain, photophobia, vision change and visual disturbance.  Ears/Nose/Throat/Neck: Denied ear, nose, throat or neck symptoms, hearing loss, nasal discharge, sinus congestion and sore throat.  Cardiovascular: Denied cardiovascular symptoms, arrhythmia, chest pain/pressure, edema, exercise intolerance, orthopnea and  palpitations.  Respiratory: Denied pulmonary symptoms, asthma, pleuritic pain, productive sputum, cough, dyspnea and wheezing.  Gastrointestinal: Denied, gastro-esophageal reflux, melena, nausea and vomiting.  Genitourinary: See HPI for additional information.  Musculoskeletal: Denied musculoskeletal symptoms, stiffness, swelling, muscle weakness and myalgia.  Dermatologic: Denied dermatology symptoms, rash and scar.  Neurologic: Denied neurology symptoms, dizziness, headache, neck pain and syncope.  Psychiatric: Denied psychiatric symptoms, anxiety and depression.  Endocrine: Denied endocrine symptoms including hot flashes and night sweats.   Meds:   Current Outpatient Medications on File Prior to Visit  Medication Sig Dispense Refill  . clobetasol cream (TEMOVATE) 0.05 % Apply 1 application topically 2 (two) times daily.    Marland Kitchen estradiol (ESTRACE) 1 MG tablet Take 1 tablet (1 mg total) by mouth daily. 30 tablet 3  . medroxyPROGESTERone (PROVERA) 2.5 MG tablet Take 1 tablet (2.5 mg total) by mouth daily. 30 tablet 3   No current facility-administered medications on file prior to visit.          Objective:     Vitals:   06/18/20 1235  BP: 132/88  Pulse: 84   Filed Weights   06/18/20 1235  Weight: 193 lb 4.8 oz (87.7 kg)              Physical examination   Pelvic:   Vulva: Normal appearance.  No lesions.  Some erythema especially along the left side.  Not significantly excoriated.  Not particularly remarkable for lichen sclerosus.  Vagina: No lesions or abnormalities noted.  Support: Normal pelvic support.  Urethra No masses tenderness or scarring.  Meatus Normal size without lesions or prolapse.  Cervix: Normal appearance.  No lesions.  Anus:  Normal exam.  No lesions.  Perineum: Normal exam.  No lesions.        Bimanual   Uterus: Normal size.  Non-tender.  Mobile.  AV.  Adnexae: No masses.  Non-tender to palpation.  Cul-de-sac: Negative for abnormality.   WET  PREP: clue cells: present, KOH (yeast): negative, odor: present and trichomoniasis: negative Ph:  > 4.5    Assessment:    [id number] Patient Active Problem List   Diagnosis Date Noted  . Family history of colon cancer in mother 03/14/2017  . Psychophysiological insomnia 02/22/2017  . Postmenopausal bleeding 11/01/2016  . Lichen sclerosus 12/30/2015  . Hot flashes, menopausal 02/11/2015  . 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 Vulvar itching   2. Lichen sclerosus   3. Bacterial vulvovaginitis     Possibly a combination of bacterial vaginosis causing some minor irritation combined with lichen sclerosus causing worsening of vulvar itching.   Plan:            1.  Treat BV with Flagyl.  2.  Patient to resume twice daily treatment with clobetasol for 2 weeks followed by twice weekly.  We discussed the difference between vulvar itching and vaginal itching and the use of clobetasol.  Orders No orders of the defined types were placed in this encounter.    Meds ordered this encounter  Medications  . metroNIDAZOLE (FLAGYL) 500 MG tablet    Sig: Take 1 tablet (500 mg total) by mouth 2 (two) times daily for 7 days.    Dispense:  14 tablet    Refill:  0      F/U  No follow-ups on file. I spent 21 minutes involved in the care of this patient preparing to see the patient by obtaining and reviewing her medical history (including labs, imaging tests and prior procedures), documenting clinical information in the electronic health record (EHR), counseling and coordinating care plans, writing and sending prescriptions, ordering tests or procedures and directly communicating with the patient by discussing pertinent items from her history and physical exam as well as detailing my assessment and plan as noted above so that she has an informed understanding.  All of her questions were answered.  Elonda Husky, M.D. 06/18/2020 12:56 PM

## 2020-06-23 ENCOUNTER — Ambulatory Visit: Admission: RE | Admit: 2020-06-23 | Payer: Medicare HMO | Source: Ambulatory Visit

## 2020-06-23 ENCOUNTER — Other Ambulatory Visit: Payer: Self-pay

## 2020-06-23 ENCOUNTER — Ambulatory Visit
Admission: RE | Admit: 2020-06-23 | Discharge: 2020-06-23 | Disposition: A | Discharge status: Home / Self Care | Payer: Medicare HMO | Source: Ambulatory Visit | Attending: Obstetrics and Gynecology | Admitting: Obstetrics and Gynecology

## 2020-06-23 DIAGNOSIS — R921 Mammographic calcification found on diagnostic imaging of breast: Secondary | ICD-10-CM | POA: Diagnosis present

## 2020-06-23 DIAGNOSIS — R928 Other abnormal and inconclusive findings on diagnostic imaging of breast: Secondary | ICD-10-CM | POA: Insufficient documentation

## 2020-09-10 ENCOUNTER — Other Ambulatory Visit: Payer: Self-pay | Admitting: Obstetrics and Gynecology

## 2020-11-23 ENCOUNTER — Other Ambulatory Visit: Payer: Self-pay | Admitting: Obstetrics and Gynecology

## 2020-11-23 DIAGNOSIS — R928 Other abnormal and inconclusive findings on diagnostic imaging of breast: Secondary | ICD-10-CM

## 2020-11-23 DIAGNOSIS — R921 Mammographic calcification found on diagnostic imaging of breast: Secondary | ICD-10-CM

## 2020-12-28 ENCOUNTER — Other Ambulatory Visit: Payer: Self-pay

## 2020-12-28 ENCOUNTER — Ambulatory Visit
Admission: RE | Admit: 2020-12-28 | Discharge: 2020-12-28 | Disposition: A | Discharge status: Home / Self Care | Payer: Medicare HMO | Source: Ambulatory Visit | Attending: Obstetrics and Gynecology | Admitting: Obstetrics and Gynecology

## 2020-12-28 DIAGNOSIS — R921 Mammographic calcification found on diagnostic imaging of breast: Secondary | ICD-10-CM | POA: Diagnosis present

## 2020-12-28 DIAGNOSIS — R928 Other abnormal and inconclusive findings on diagnostic imaging of breast: Secondary | ICD-10-CM | POA: Diagnosis not present

## 2021-01-19 ENCOUNTER — Other Ambulatory Visit: Payer: Self-pay | Admitting: Obstetrics and Gynecology

## 2021-04-28 ENCOUNTER — Encounter: Payer: Self-pay | Admitting: Obstetrics and Gynecology

## 2021-05-20 ENCOUNTER — Ambulatory Visit (INDEPENDENT_AMBULATORY_CARE_PROVIDER_SITE_OTHER): Payer: Medicare HMO | Admitting: Obstetrics and Gynecology

## 2021-05-20 ENCOUNTER — Other Ambulatory Visit: Payer: Self-pay

## 2021-05-20 ENCOUNTER — Encounter: Payer: Self-pay | Admitting: Obstetrics and Gynecology

## 2021-05-20 VITALS — BP 116/78 | HR 79 | Ht 66.0 in | Wt 192.7 lb

## 2021-05-20 DIAGNOSIS — N941 Unspecified dyspareunia: Secondary | ICD-10-CM | POA: Diagnosis not present

## 2021-05-20 DIAGNOSIS — L900 Lichen sclerosus et atrophicus: Secondary | ICD-10-CM | POA: Diagnosis not present

## 2021-05-20 DIAGNOSIS — Z1231 Encounter for screening mammogram for malignant neoplasm of breast: Secondary | ICD-10-CM

## 2021-05-20 DIAGNOSIS — Z01419 Encounter for gynecological examination (general) (routine) without abnormal findings: Secondary | ICD-10-CM | POA: Diagnosis not present

## 2021-05-20 DIAGNOSIS — Z780 Asymptomatic menopausal state: Secondary | ICD-10-CM | POA: Diagnosis not present

## 2021-05-20 MED ORDER — MEDROXYPROGESTERONE ACETATE 2.5 MG PO TABS: 2.5000 mg | ORAL_TABLET | Freq: Every day | ORAL | 3 refills | Status: DC

## 2021-05-20 MED ORDER — ESTRADIOL 1 MG PO TABS: 1.0000 mg | ORAL_TABLET | Freq: Every day | ORAL | 3 refills | Status: DC

## 2021-05-20 MED ORDER — ESTRADIOL 0.1 MG/GM VA CREA: 0.2500 | TOPICAL_CREAM | Freq: Every day | VAGINAL | 3 refills | Status: AC

## 2021-05-20 MED ORDER — CLOBETASOL PROPIONATE 0.05 % EX CREA: 1.0000 "application " | TOPICAL_CREAM | CUTANEOUS | 1 refills | Status: DC

## 2021-05-20 NOTE — Progress Notes (Signed)
HPI: ?     [name] is a 66 y.o. [phone] who LMP was No LMP recorded. Patient is postmenopausal. ? ?Subjective:  ? ?She presents today for her annual examination.  She continues to have occasional itching but when she does she uses clobetasol and it relieves the itching.  She is satisfied with this. ?She does report that she has occasional discomfort with intercourse.  She does use lubricants however these are only partially successful. ?She is otherwise doing well.  She and her family are planning a trip to [place] and she is looking forward to this especially seeing the excitement on the faces of her grandchildren. 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 ? ?  Hx: ?The following portions of the patient's history were reviewed and updated as appropriate: ?            She  has a past medical history of Anal fissure and Unspecified hemorrhoids without mention of complication (2012). ?She does not have any pertinent problems on file. ?She  has a past surgical history that includes Colonoscopy (2010,01/28/2012); Hemorrhoidectomy with hemorrhoid banding (2004); Rotator cuff repair (2006); Bunionectomy (Right); and Colonoscopy with propofol (N/A, 03/22/2017). ?Her family history includes Colon cancer in her mother; Prostate cancer in her father. ?She  reports that she has never smoked. She has never used smokeless tobacco. She reports current alcohol use. She reports that she does not use drugs. ?She has a current medication list which includes the following prescription(s): estradiol, clobetasol cream, estradiol, and medroxyprogesterone. ?She has No Known Allergies. ?      ?Review of Systems:  ?Review of Systems ? ?Constitutional: Denied constitutional symptoms, night sweats, recent illness, fatigue, fever, insomnia and weight loss.  ?Eyes: Denied eye symptoms, eye pain, photophobia, vision change and visual  disturbance.  ?Ears/Nose/Throat/Neck: Denied ear, nose, throat or neck symptoms, hearing loss, nasal discharge, sinus congestion and sore throat.  ?Cardiovascular: Denied cardiovascular symptoms, arrhythmia, chest pain/pressure, edema, exercise intolerance, orthopnea and palpitations.  ?Respiratory: Denied pulmonary symptoms, asthma, pleuritic pain, productive sputum, cough, dyspnea and wheezing.  ?Gastrointestinal: Denied, gastro-esophageal reflux, melena, nausea and vomiting.  ?Genitourinary: See HPI for additional information.  ?Musculoskeletal: Denied musculoskeletal symptoms, stiffness, swelling, muscle weakness and myalgia.  ?Dermatologic: Denied dermatology symptoms, rash and scar.  ?Neurologic: Denied neurology symptoms, dizziness, headache, neck pain and syncope.  ?Psychiatric: Denied psychiatric symptoms, anxiety and depression.  ?Endocrine: Denied endocrine symptoms including hot flashes and night sweats.  ? ?Meds: ?  ?No current outpatient medications on file prior to visit.  ? ?No current facility-administered medications on file prior to visit.  ? ? ? ?Objective:  ?  ? ?Vitals:  ? 05/20/21 0922  ?BP: 116/78  ?Pulse: 79  ?  ?Filed Weights  ? 05/20/21 2423  ?Weight: 192 lb 11.2 oz (87.4 kg)  ? ?  ?         Physical examination ?General NAD, Conversant  ?HEENT Atraumatic; Op clear with mmm.  Normo-cephalic. Pupils reactive. Anicteric sclerae  ?Thyroid/Neck Smooth without nodularity or enlargement. Normal ROM.  Neck Supple.  ?Skin No rashes, lesions or ulceration. Normal palpated skin turgor. No nodularity.  ?Breasts: No masses or discharge.  Symmetric.  No axillary adenopathy.  ?Lungs: Clear to auscultation.No rales or wheezes. Normal Respiratory effort, no retractions.  ?Heart: NSR.  No murmurs or rubs appreciated. No periferal edema  ?Abdomen: Soft.  Non-tender.  No masses.  No HSM. No hernia  ?Extremities: Moves all appropriately.  Normal ROM for age. No lymphadenopathy.  ?Neuro: Oriented to PPT.   Normal mood. Normal affect.  ? ?  Pelvic:   ?Vulva: Normal appearance.  No lesions.  ?Vagina: No lesions or abnormalities noted.  Moderate atrophy  ?Support: Cystocele rectocele present  ?Urethra No masses tenderness or scarring.  ?Meatus Normal size without lesions or prolapse.  ?Cervix: Normal appearance.  No lesions.  ?Anus: Normal exam.  No lesions.  ?Perineum: Normal exam.  No lesions.  ?      Bimanual   ?Uterus: Normal size.  Non-tender.  Mobile.  AV.  ?Adnexae: No masses.  Non-tender to palpation.  ?Cul-de-sac: Negative for abnormality.  ? ? ? ?Assessment:  ?  ?[id number] ?Patient Active Problem List  ? Diagnosis Date Noted  ? Family history of colon cancer in mother 03/14/2017  ? Psychophysiological insomnia 02/22/2017  ? Postmenopausal bleeding 11/01/2016  ? Lichen sclerosus 12/30/2015  ? Hot flashes, menopausal 02/11/2015  ? 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 ?  ?1. Well woman exam with routine gynecological exam   ?2. Lichen sclerosus   ?3. Postmenopausal   ?4. Dyspareunia in female   ? ?  ? ? ?Plan:  ?  ?       ? 1.  Basic Screening Recommendations ?The basic screening recommendations for asymptomatic women were discussed with the patient during her visit.  The age-appropriate recommendations were discussed with her and the rational for the tests reviewed.  When I am informed by the patient that another primary care physician has previously obtained the age-appropriate tests and they are up-to-date, only outstanding tests are ordered and referrals given as necessary.  Abnormal results of tests will be discussed with her when all of her results are completed.  Routine preventative health maintenance measures emphasized: Exercise/Diet/Weight control, Tobacco Warnings, Alcohol/Substance use risks and Stress Management ?Mammogram scheduled for April -blood work today -DEXA ordered ?2.  Patient would like to trial Estrace vaginal cream for short course to see if this improves her dyspareunia. ?3.  Happy on  HRT-continue ? ?Orders ?Orders Placed This Encounter  ?Procedures  ? DG Bone Density  ? Basic metabolic panel  ? CBC  ? TSH  ? Lipid panel  ? Hemoglobin A1c  ? ?  ?Meds ordered this encounter  ?Medications  ? estradiol (ESTRACE) 1 MG tablet  ?  Sig: Take 1 tablet (1 mg total) by mouth daily.  ?  Dispense:  90 tablet  ?  Refill:  3  ? medroxyPROGESTERone (PROVERA) 2.5 MG tablet  ?  Sig: Take 1 tablet (2.5 mg total) by mouth daily.  ?  Dispense:  90 tablet  ?  Refill:  3  ? estradiol (ESTRACE) 0.1 MG/GM vaginal cream  ?  Sig: Place 0.25 Applicatorfuls vaginally at bedtime.  ?  Dispense:  90 g  ?  Refill:  3  ? clobetasol cream (TEMOVATE) 0.05 %  ?  Sig: Apply 1 application. topically 2 (two) times a week.  ?  Dispense:  60 g  ?  Refill:  1  ?    ? ?  ?  F/U ? Return in about 1 year (around 05/21/2022) for Annual Physical. ? ?Elonda Husky, M.D. ?05/20/2021 ?9:51 AM ? ? ? ?

## 2021-05-20 NOTE — Progress Notes (Signed)
Patients presents for annual exam today. Patient states ____________. ? ?Patient is due for pap smear, ordered. Patient is due for mammogram, ordered. Patients annual labs are ordered.  ? ?Patient is up to date on pap smear.  ? ?Patient states no other questions or concerns at this time.  ? ?

## 2021-05-21 ENCOUNTER — Encounter: Payer: Self-pay | Admitting: Obstetrics and Gynecology

## 2021-05-21 LAB — TSH: TSH: 3.42 u[IU]/mL (ref 0.450–4.500)

## 2021-05-21 LAB — BASIC METABOLIC PANEL
BUN/Creatinine Ratio: 19 (ref 12–28)
BUN: 15 mg/dL (ref 8–27)
CO2: 24 mmol/L (ref 20–29)
Calcium: 9.4 mg/dL (ref 8.7–10.3)
Chloride: 103 mmol/L (ref 96–106)
Creatinine, Ser: 0.8 mg/dL (ref 0.57–1.00)
Glucose: 88 mg/dL (ref 70–99)
Potassium: 4.4 mmol/L (ref 3.5–5.2)
Sodium: 141 mmol/L (ref 134–144)
eGFR: 81 mL/min/{1.73_m2} (ref >59)

## 2021-05-21 LAB — HEMOGLOBIN A1C
Est. average glucose Bld gHb Est-mCnc: 97 mg/dL
Hgb A1c MFr Bld: 5 % (ref 4.8–5.6)

## 2021-05-21 LAB — CBC
Hematocrit: 44.8 % (ref 34.0–46.6)
Hemoglobin: 14.8 g/dL (ref 11.1–15.9)
MCH: 32.7 pg (ref 26.6–33.0)
MCHC: 33 g/dL (ref 31.5–35.7)
MCV: 99 fL — ABNORMAL HIGH (ref 79–97)
Platelets: 326 10*3/uL (ref 150–450)
RBC: 4.52 x10E6/uL (ref 3.77–5.28)
RDW: 11 % — ABNORMAL LOW (ref 11.7–15.4)
WBC: 5 10*3/uL (ref 3.4–10.8)

## 2021-05-21 LAB — LIPID PANEL
Chol/HDL Ratio: 3.2 ratio (ref 0.0–4.4)
Cholesterol, Total: 199 mg/dL (ref 100–199)
HDL: 63 mg/dL (ref >39)
LDL Chol Calc (NIH): 127 mg/dL — ABNORMAL HIGH (ref 0–99)
Triglycerides: 49 mg/dL (ref 0–149)
VLDL Cholesterol Cal: 9 mg/dL (ref 5–40)

## 2021-05-26 ENCOUNTER — Other Ambulatory Visit: Payer: Self-pay | Admitting: Obstetrics and Gynecology

## 2021-05-26 DIAGNOSIS — R921 Mammographic calcification found on diagnostic imaging of breast: Secondary | ICD-10-CM

## 2021-07-21 ENCOUNTER — Ambulatory Visit
Admission: RE | Admit: 2021-07-21 | Discharge: 2021-07-21 | Disposition: A | Discharge status: Home / Self Care | Payer: Medicare HMO | Source: Ambulatory Visit | Attending: Obstetrics and Gynecology | Admitting: Obstetrics and Gynecology

## 2021-07-21 DIAGNOSIS — R921 Mammographic calcification found on diagnostic imaging of breast: Secondary | ICD-10-CM | POA: Diagnosis present

## 2021-07-21 DIAGNOSIS — Z780 Asymptomatic menopausal state: Secondary | ICD-10-CM | POA: Diagnosis present

## 2021-07-21 DIAGNOSIS — Z01419 Encounter for gynecological examination (general) (routine) without abnormal findings: Secondary | ICD-10-CM | POA: Diagnosis present

## 2021-07-25 NOTE — Progress Notes (Signed)
Based on these results, please have her schedule a visit with me.  May do video if desired.

## 2021-07-26 ENCOUNTER — Encounter: Payer: Self-pay | Admitting: Obstetrics and Gynecology

## 2021-07-27 ENCOUNTER — Encounter: Payer: Self-pay | Admitting: Obstetrics and Gynecology

## 2021-07-27 ENCOUNTER — Telehealth: Payer: Medicare HMO | Admitting: Obstetrics and Gynecology

## 2021-07-27 DIAGNOSIS — M810 Age-related osteoporosis without current pathological fracture: Secondary | ICD-10-CM

## 2021-07-27 MED ORDER — ALENDRONATE SODIUM 70 MG PO TABS: 70.0000 mg | ORAL_TABLET | ORAL | 3 refills | Status: DC

## 2021-07-27 NOTE — Progress Notes (Signed)
Virtual Visit via Video Note  I connected with Elaine Graves on 07/27/21 at  7:45 AM EDT by video and verified that I was speaking with the correct person using two identifiers.    [name] is a 66 y.o. [phone] who LMP was No LMP recorded. Patient is postmenopausal. I discussed the limitations, risks, security and privacy concerns of performing an evaluation and management service by video and the availability of in person appointments. I also discussed with the patient that there may be a patient responsible charge related to this service. The patient expressed understanding and agreed to proceed.  Location of patient:  HOME  Patient gave explicit verbal consent for video visit:  YES  Location of provider:  [facility] office  Persons other than physician and patient involved in provider conference:  None   Subjective:   History of Present Illness:    She was recently found by DEXA scan to have osteoporosis of the hip and spine.  She also notes she has "lost 2 inches" and not around her waist." Of significant note she has started an exercise program and is dieting causing a purposeful loss of 22 pounds. She states she had an excellent trip to [place] with her grandchildren to [employer]. She is currently planning a trip to [facility] and [place]. She is not using calcium or vitamin D at this time but as noted above she is exercising regularly. She continues to use estrogen orally.  Hx: The following portions of the patient's history were reviewed and updated as appropriate:             She  has a past medical history of Anal fissure and Unspecified hemorrhoids without mention of complication (0000000). She does not have any pertinent problems on file. She  has a past surgical history that includes Colonoscopy (2010,01/28/2012); Hemorrhoidectomy with hemorrhoid banding (2004); Rotator cuff repair (2006); Bunionectomy (Right); and Colonoscopy with propofol  (N/A, 03/22/2017). Her family history includes Colon cancer in her mother; Prostate cancer in her father. She  reports that she has never smoked. She has never used smokeless tobacco. She reports current alcohol use. She reports that she does not use drugs. She has a current medication list which includes the following prescription(s): alendronate, clobetasol cream, estradiol, estradiol, and medroxyprogesterone. She has No Known Allergies.       Review of Systems:  Review of Systems  Constitutional: Denied constitutional symptoms, night sweats, recent illness, fatigue, fever, insomnia and weight loss.  Eyes: Denied eye symptoms, eye pain, photophobia, vision change and visual disturbance.  Ears/Nose/Throat/Neck: Denied ear, nose, throat or neck symptoms, hearing loss, nasal discharge, sinus congestion and sore throat.  Cardiovascular: Denied cardiovascular symptoms, arrhythmia, chest pain/pressure, edema, exercise intolerance, orthopnea and palpitations.  Respiratory: Denied pulmonary symptoms, asthma, pleuritic pain, productive sputum, cough, dyspnea and wheezing.  Gastrointestinal: Denied, gastro-esophageal reflux, melena, nausea and vomiting.  Genitourinary: Denied genitourinary symptoms including symptomatic vaginal discharge, pelvic relaxation issues, and urinary complaints.  Musculoskeletal: Denied musculoskeletal symptoms, stiffness, swelling, muscle weakness and myalgia.  Dermatologic: Denied dermatology symptoms, rash and scar.  Neurologic: Denied neurology symptoms, dizziness, headache, neck pain and syncope.  Psychiatric: Denied psychiatric symptoms, anxiety and depression.  Endocrine: Denied endocrine symptoms including hot flashes and night sweats.   Meds:   Current Outpatient Medications on File Prior to Visit  Medication Sig Dispense Refill   clobetasol cream (TEMOVATE) [id number] % Apply 1 application. topically 2 (two) times a week. 60 g 1  estradiol (ESTRACE) 0.1 MG/GM vaginal  cream Place [id number] Applicatorfuls vaginally at bedtime. 90 g 3   estradiol (ESTRACE) 1 MG tablet Take 1 tablet (1 mg total) by mouth daily. 90 tablet 3   medroxyPROGESTERone (PROVERA) 2.5 MG tablet Take 1 tablet (2.5 mg total) by mouth daily. 90 tablet 3   No current facility-administered medications on file prior to visit.    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 Age-related osteoporosis without current pathological fracture       Plan:            1.  We have discussed osteoporosis in detail.  Weightbearing exercise, use of calcium and vitamin D and the dosing recommendations discussed.  The effects of estrogen on bone loss discussed.  I have also recommend the use of Fosamax 1 time per week.  Prescription given.  Patient instructed in its use. We will plan repeat DEXA in 1 year to see progress. Orders No orders of the defined types were placed in this encounter.    Meds ordered this encounter  Medications   alendronate (FOSAMAX) 70 MG tablet    Sig: Take 1 tablet (70 mg total) by mouth once a week.    Dispense:  13 tablet    Refill:  3      F/U  Return for Annual Physical. I spent 24 minutes involved in the care of this patient preparing to see the patient by obtaining and reviewing her medical history (including labs, imaging tests and prior procedures), documenting clinical information in the electronic health record (EHR), counseling and coordinating care plans, writing and sending prescriptions, ordering tests or procedures and in direct communicating with the patient and medical staff discussing pertinent items from her history and physical exam.   Finis Bud, M.D. 07/27/2021 8:08 AM

## 2022-04-13 ENCOUNTER — Other Ambulatory Visit: Payer: Self-pay | Admitting: Obstetrics and Gynecology

## 2022-04-13 DIAGNOSIS — M810 Age-related osteoporosis without current pathological fracture: Secondary | ICD-10-CM

## 2022-05-18 ENCOUNTER — Telehealth: Payer: Self-pay

## 2022-05-18 NOTE — Telephone Encounter (Signed)
Advised via mychart

## 2022-05-24 ENCOUNTER — Encounter: Payer: Self-pay | Admitting: Obstetrics and Gynecology

## 2022-05-24 ENCOUNTER — Ambulatory Visit (INDEPENDENT_AMBULATORY_CARE_PROVIDER_SITE_OTHER): Payer: Medicare HMO | Admitting: Obstetrics and Gynecology

## 2022-05-24 VITALS — BP 132/78 | HR 81 | Ht 66.0 in | Wt 167.9 lb

## 2022-05-24 DIAGNOSIS — Z780 Asymptomatic menopausal state: Secondary | ICD-10-CM

## 2022-05-24 DIAGNOSIS — Z01419 Encounter for gynecological examination (general) (routine) without abnormal findings: Secondary | ICD-10-CM | POA: Diagnosis not present

## 2022-05-24 DIAGNOSIS — L900 Lichen sclerosus et atrophicus: Secondary | ICD-10-CM

## 2022-05-24 DIAGNOSIS — Z1231 Encounter for screening mammogram for malignant neoplasm of breast: Secondary | ICD-10-CM

## 2022-05-24 MED ORDER — CLOBETASOL PROPIONATE 0.05 % EX CREA: 1.0000 | TOPICAL_CREAM | CUTANEOUS | 1 refills | Status: AC

## 2022-05-24 MED ORDER — ESTRADIOL 1 MG PO TABS: 1.0000 mg | ORAL_TABLET | Freq: Every day | ORAL | 3 refills | Status: DC

## 2022-05-24 MED ORDER — MEDROXYPROGESTERONE ACETATE 2.5 MG PO TABS: 2.5000 mg | ORAL_TABLET | Freq: Every day | ORAL | 3 refills | Status: DC

## 2022-05-24 NOTE — Progress Notes (Signed)
HPI:      [name] is a 67 y.o. [phone] who LMP was No LMP recorded. Patient is postmenopausal.  Subjective:   She presents today for her annual examination.  She reports she is doing well.  She has lost more than 20 pounds of intentional weight loss.  She continues to use the estrogen cream tablet Provera and clobetasol.  She does complain of occasional vaginal dryness with intercourse but she uses lubricants and says she is generally okay.  She is using the clobetasol twice weekly.    Hx: The following portions of the patient's history were reviewed and updated as appropriate:             She  has a past medical history of Anal fissure and Unspecified hemorrhoids without mention of complication (0000000). She does not have any pertinent problems on file. She  has a past surgical history that includes Colonoscopy (2010,01/28/2012); Hemorrhoidectomy with hemorrhoid banding (2004); Rotator cuff repair (2006); Bunionectomy (Right); and Colonoscopy with propofol (N/A, 03/22/2017). Her family history includes Colon cancer in her mother; Prostate cancer in her father. She  reports that she has never smoked. She has never used smokeless tobacco. She reports current alcohol use. She reports that she does not use drugs. She has a current medication list which includes the following prescription(s): alendronate, [START ON 05/26/2022] clobetasol cream, estradiol, and medroxyprogesterone. She has No Known Allergies.       Review of Systems:  Review of Systems  Constitutional: Denied constitutional symptoms, night sweats, recent illness, fatigue, fever, insomnia and weight loss.  Eyes: Denied eye symptoms, eye pain, photophobia, vision change and visual disturbance.  Ears/Nose/Throat/Neck: Denied ear, nose, throat or neck symptoms, hearing loss, nasal discharge, sinus congestion and sore throat.  Cardiovascular: Denied cardiovascular symptoms, arrhythmia, chest pain/pressure, edema, exercise  intolerance, orthopnea and palpitations.  Respiratory: Denied pulmonary symptoms, asthma, pleuritic pain, productive sputum, cough, dyspnea and wheezing.  Gastrointestinal: Denied, gastro-esophageal reflux, melena, nausea and vomiting.  Genitourinary: Denied genitourinary symptoms including symptomatic vaginal discharge, pelvic relaxation issues, and urinary complaints.  Musculoskeletal: Denied musculoskeletal symptoms, stiffness, swelling, muscle weakness and myalgia.  Dermatologic: Denied dermatology symptoms, rash and scar.  Neurologic: Denied neurology symptoms, dizziness, headache, neck pain and syncope.  Psychiatric: Denied psychiatric symptoms, anxiety and depression.  Endocrine: Denied endocrine symptoms including hot flashes and night sweats.   Meds:   Current Outpatient Medications on File Prior to Visit  Medication Sig Dispense Refill   alendronate (FOSAMAX) 70 MG tablet TAKE ONE TABLET BY MOUTH EACH WEEK, ON AN EMPTY STOMACH BEFORE BREAKFAST WITH 8oz OF WATER AND REMAIN UPRIGHT FOR :30 6 tablet 0   No current facility-administered medications on file prior to visit.     Objective:     Vitals:   05/24/22 0814  BP: 132/78  Pulse: 81    Filed Weights   05/24/22 0814  Weight: 167 lb 14.4 oz (76.2 kg)              Physical examination General NAD, Conversant  HEENT Atraumatic; Op clear with mmm.  Normo-cephalic. Pupils reactive. Anicteric sclerae  Thyroid/Neck Smooth without nodularity or enlargement. Normal ROM.  Neck Supple.  Skin No rashes, lesions or ulceration. Normal palpated skin turgor. No nodularity.  Breasts: No masses or discharge.  Symmetric.  No axillary adenopathy.  Lungs: Clear to auscultation.No rales or wheezes. Normal Respiratory effort, no retractions.  Heart: NSR.  No murmurs or rubs appreciated. No peripheral edema  Abdomen: Soft.  Non-tender.  No masses.  No HSM. No hernia  Extremities: Moves all appropriately.  Normal ROM for age. No  lymphadenopathy.  Neuro: Oriented to PPT.  Normal mood. Normal affect.     Pelvic:   Vulva: Normal appearance.  No lesions.  Vagina: No lesions or abnormalities noted.  Support: Second-degree cystocele -some generalized pelvic relaxation.  Urethra No masses tenderness or scarring.  Meatus Normal size without lesions or prolapse.  Cervix: Normal appearance.  No lesions.  Anus: Normal exam.  No lesions.  Perineum: Normal exam.  No lesions.        Bimanual   Uterus: Normal size.  Non-tender.  Mobile.  AV.  Adnexae: No masses.  Non-tender to palpation.  Cul-de-sac: Negative for abnormality.     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 Well woman exam with routine gynecological exam   2. Lichen sclerosus   3. Postmenopausal   4. Screening mammogram for breast cancer        Plan:            1.  Basic Screening Recommendations The basic screening recommendations for asymptomatic women were discussed with the patient during her visit.  The age-appropriate recommendations were discussed with her and the rational for the tests reviewed.  When I am informed by the patient that another primary care physician has previously obtained the age-appropriate tests and they are up-to-date, only outstanding tests are ordered and referrals given as necessary.  Abnormal results of tests will be discussed with her when all of her results are completed.  Routine preventative health maintenance measures emphasized: Exercise/Diet/Weight control, Tobacco Warnings, Alcohol/Substance use risks and Stress Management Mammogram ordered 2.  Prescriptions refilled. Orders Orders Placed This Encounter  Procedures   MM DIGITAL  SCREENING BILATERAL   Basic metabolic panel   CBC   TSH   Lipid panel   Hemoglobin A1c     Meds ordered this encounter  Medications   clobetasol cream (TEMOVATE) 0.05 %    Sig: Apply 1 Application topically 2 (two) times a week.    Dispense:  60 g    Refill:  1   estradiol (ESTRACE) 1 MG tablet    Sig: Take 1 tablet (1 mg total) by mouth daily.    Dispense:  90 tablet    Refill:  3   medroxyPROGESTERone (PROVERA) 2.5 MG tablet    Sig: Take 1 tablet (2.5 mg total) by mouth daily.    Dispense:  90 tablet    Refill:  3          F/U  Return in about 1 year (around 05/24/2023) for Annual Physical.  Finis Bud, M.D. 05/24/2022 8:53 AM

## 2022-05-24 NOTE — Progress Notes (Signed)
Patients presents for annual exam today. She states doing well over the last year, continuing to work on losing weight. Patient is postmenopausal, doing well with HRT.  She is due for mammogram, ordered. Annual labs are ordered. She states no other questions or concerns at this time.

## 2022-05-25 LAB — CBC
Hematocrit: 42.3 % (ref 34.0–46.6)
Hemoglobin: 13.8 g/dL (ref 11.1–15.9)
MCH: 33.8 pg — ABNORMAL HIGH (ref 26.6–33.0)
MCHC: 32.6 g/dL (ref 31.5–35.7)
MCV: 104 fL — ABNORMAL HIGH (ref 79–97)
Platelets: 323 10*3/uL (ref 150–450)
RBC: 4.08 x10E6/uL (ref 3.77–5.28)
RDW: 11.4 % — ABNORMAL LOW (ref 11.7–15.4)
WBC: 6 10*3/uL (ref 3.4–10.8)

## 2022-05-25 LAB — LIPID PANEL
Chol/HDL Ratio: 2.5 ratio (ref 0.0–4.4)
Cholesterol, Total: 186 mg/dL (ref 100–199)
HDL: 75 mg/dL (ref >39)
LDL Chol Calc (NIH): 98 mg/dL (ref 0–99)
Triglycerides: 67 mg/dL (ref 0–149)
VLDL Cholesterol Cal: 13 mg/dL (ref 5–40)

## 2022-05-25 LAB — BASIC METABOLIC PANEL
BUN/Creatinine Ratio: 17 (ref 12–28)
BUN: 15 mg/dL (ref 8–27)
CO2: 22 mmol/L (ref 20–29)
Calcium: 9.5 mg/dL (ref 8.7–10.3)
Chloride: 101 mmol/L (ref 96–106)
Creatinine, Ser: 0.88 mg/dL (ref 0.57–1.00)
Glucose: 88 mg/dL (ref 70–99)
Potassium: 4.2 mmol/L (ref 3.5–5.2)
Sodium: 139 mmol/L (ref 134–144)
eGFR: 72 mL/min/{1.73_m2} (ref >59)

## 2022-05-25 LAB — TSH: TSH: 3.24 u[IU]/mL (ref 0.450–4.500)

## 2022-05-25 LAB — HEMOGLOBIN A1C
Est. average glucose Bld gHb Est-mCnc: 103 mg/dL
Hgb A1c MFr Bld: 5.2 % (ref 4.8–5.6)

## 2022-07-04 ENCOUNTER — Other Ambulatory Visit: Payer: Self-pay | Admitting: Obstetrics and Gynecology

## 2022-07-04 DIAGNOSIS — M810 Age-related osteoporosis without current pathological fracture: Secondary | ICD-10-CM

## 2022-07-26 ENCOUNTER — Other Ambulatory Visit: Payer: Self-pay | Admitting: Obstetrics and Gynecology

## 2022-07-26 DIAGNOSIS — R921 Mammographic calcification found on diagnostic imaging of breast: Secondary | ICD-10-CM

## 2022-08-08 ENCOUNTER — Ambulatory Visit
Admission: RE | Admit: 2022-08-08 | Discharge: 2022-08-08 | Disposition: A | Discharge status: Home / Self Care | Payer: Medicare HMO | Source: Ambulatory Visit | Attending: Obstetrics and Gynecology | Admitting: Obstetrics and Gynecology

## 2022-08-08 DIAGNOSIS — R921 Mammographic calcification found on diagnostic imaging of breast: Secondary | ICD-10-CM | POA: Diagnosis present

## 2022-10-15 ENCOUNTER — Other Ambulatory Visit: Payer: Self-pay | Admitting: Obstetrics and Gynecology

## 2022-10-15 DIAGNOSIS — M810 Age-related osteoporosis without current pathological fracture: Secondary | ICD-10-CM

## 2022-12-09 ENCOUNTER — Telehealth: Payer: Self-pay

## 2022-12-09 DIAGNOSIS — M810 Age-related osteoporosis without current pathological fracture: Secondary | ICD-10-CM

## 2022-12-09 MED ORDER — ALENDRONATE SODIUM 70 MG PO TABS: 70.0000 mg | ORAL_TABLET | ORAL | 3 refills | Status: DC

## 2022-12-09 NOTE — Telephone Encounter (Signed)
Rx refilled.

## 2023-01-24 ENCOUNTER — Encounter: Payer: Self-pay | Admitting: Obstetrics and Gynecology

## 2023-02-14 ENCOUNTER — Ambulatory Visit: Payer: Medicare HMO | Admitting: Family

## 2023-04-24 ENCOUNTER — Other Ambulatory Visit: Payer: Self-pay | Admitting: Obstetrics and Gynecology

## 2023-04-24 DIAGNOSIS — M810 Age-related osteoporosis without current pathological fracture: Secondary | ICD-10-CM

## 2023-07-18 ENCOUNTER — Other Ambulatory Visit: Payer: Self-pay | Admitting: Obstetrics and Gynecology

## 2023-07-18 DIAGNOSIS — Z780 Asymptomatic menopausal state: Secondary | ICD-10-CM

## 2023-07-24 ENCOUNTER — Other Ambulatory Visit: Payer: Self-pay | Admitting: Obstetrics and Gynecology

## 2023-07-24 DIAGNOSIS — Z1231 Encounter for screening mammogram for malignant neoplasm of breast: Secondary | ICD-10-CM

## 2023-08-16 ENCOUNTER — Ambulatory Visit
Admission: RE | Admit: 2023-08-16 | Discharge: 2023-08-16 | Disposition: A | Discharge status: Home / Self Care | Source: Ambulatory Visit | Attending: Obstetrics and Gynecology | Admitting: Obstetrics and Gynecology

## 2023-08-16 DIAGNOSIS — Z1231 Encounter for screening mammogram for malignant neoplasm of breast: Secondary | ICD-10-CM | POA: Diagnosis not present

## 2023-10-05 ENCOUNTER — Ambulatory Visit (INDEPENDENT_AMBULATORY_CARE_PROVIDER_SITE_OTHER): Payer: Medicare HMO | Admitting: Family

## 2023-10-05 ENCOUNTER — Other Ambulatory Visit: Payer: Self-pay | Admitting: Family

## 2023-10-05 ENCOUNTER — Encounter: Payer: Self-pay | Admitting: Family

## 2023-10-05 ENCOUNTER — Ambulatory Visit: Payer: Medicare HMO | Admitting: Family

## 2023-10-05 VITALS — BP 124/76 | HR 78 | Temp 97.7°F | Ht 65.0 in | Wt 172.8 lb

## 2023-10-05 DIAGNOSIS — M810 Age-related osteoporosis without current pathological fracture: Secondary | ICD-10-CM

## 2023-10-05 DIAGNOSIS — Z136 Encounter for screening for cardiovascular disorders: Secondary | ICD-10-CM

## 2023-10-05 DIAGNOSIS — Z13220 Encounter for screening for lipoid disorders: Secondary | ICD-10-CM | POA: Diagnosis not present

## 2023-10-05 DIAGNOSIS — N898 Other specified noninflammatory disorders of vagina: Secondary | ICD-10-CM

## 2023-10-05 DIAGNOSIS — Z1159 Encounter for screening for other viral diseases: Secondary | ICD-10-CM

## 2023-10-05 DIAGNOSIS — Z79890 Hormone replacement therapy (postmenopausal): Secondary | ICD-10-CM

## 2023-10-05 DIAGNOSIS — N951 Menopausal and female climacteric states: Secondary | ICD-10-CM

## 2023-10-05 MED ORDER — ESTRADIOL 0.5 MG PO TABS: 0.5000 mg | ORAL_TABLET | Freq: Every day | ORAL | 0 refills | Status: DC

## 2023-10-05 MED ORDER — ESTRADIOL 0.1 MG/GM VA CREA: TOPICAL_CREAM | VAGINAL | 12 refills | Status: AC

## 2023-10-05 NOTE — Assessment & Plan Note (Signed)
 She has been on fosamax  since 07/2021.Plan for holiday in 2028.   Reviewed DEXA report and FRAX risk. Discussed risks of biphosphonates including jaw necrosis, long bone fracture and esophagitis. Patient has no upcoming dental procedures and understand the importance of discussing medication with her dentist as well as letting me know if any dental , oral procedures beyond regular teeth cleaning.  Discussed how to properly take medication.  Discussed that we may consider drug holiday from fosamax  if stable at 5 years if low to moderate risk for fracture and if BMD remains stable annually during drug holiday.    Education in regards to medication provided as well as typed up on AVS. All questions answered. Close follow up to monitor for adverse side effects.

## 2023-10-05 NOTE — Patient Instructions (Addendum)
 Wean off of oral estrogen by starting 0.5 mg daily.  After couple of weeks she may then take 0.5 mg every other day for a week or 2 and then you may stop medication.  You may also stop progesterone with your last dose of ora estrogen.  Send me a Clinical cytogeneticist message with Shingrex vaccine dates  Trial of vaginal Estrace   For post menopausal women, guidelines recommend a diet with 1200 mg of Calcium per day. If you are eating calcium rich foods, you do not need a calcium supplement. The body better absorbs the calcium that you eat over supplementation. If you do supplement, I recommend not supplementing the full 1200 mg/ day as this can lead to increased risk of cardiovascular disease. I recommend Calcium Citrate over the counter, and you may take a total of 600 to 800 mg per day in divided doses with meals for best absorption.   For bone health, you need adequate vitamin D , and I recommend you supplement as it is harder to do so with diet alone. I recommend cholecalciferol 800 units daily.  Also, please ensure you are following a diet high in calcium -- research shows better outcomes with dietary sources including kale, yogurt, broccolii, cheese, okra, almonds- to name a few.     Also remember that exercise is a great medicine for maintain and preserve bone health. Advise moderate exercise for 30 minutes , 3 times per week.    Essentials for good sleep:   #1 Exercise #2 Limit Caffeine ( no caffeine after lunch) #3 No smart phones, TV prior to bed -- BLUE light is VERY activating and send the brain an 'awake message.'  #4 Go to bed at same time of night each night and get up at same time of day.  #5 Take 0.5 to 5mg  melatonin at 7pm with dinner -this is when natural melatonin will start to increase You may also trial melatonin combination over the counter supplement such as Sleep#3 ( L- theanine, camomile, lavender, valerian root, and melatonin 10mg )   or Qunol Sleep 5 in 1 (melatonin 5mg , Ashwagandha,  GABA, valerian root, L-theanine)

## 2023-10-05 NOTE — Progress Notes (Signed)
 Assessment & Plan:  Osteoporosis, unspecified osteoporosis type, unspecified pathological fracture presence Assessment & Plan: She has been on fosamax  since 07/2021.Plan for holiday in 2028.   Reviewed DEXA report and FRAX risk. Discussed risks of biphosphonates including jaw necrosis, long bone fracture and esophagitis. Patient has no upcoming dental procedures and understand the importance of discussing medication with her dentist as well as letting me know if any dental , oral procedures beyond regular teeth cleaning.  Discussed how to properly take medication.  Discussed that we may consider drug holiday from fosamax  if stable at 5 years if low to moderate risk for fracture and if BMD remains stable annually during drug holiday.    Education in regards to medication provided as well as typed up on AVS. All questions answered. Close follow up to monitor for adverse side effects.   Orders: -     CBC with Differential/Platelet; Future -     Comprehensive metabolic panel with GFR; Future -     TSH; Future -     VITAMIN D  25 Hydroxy (Vit-D Deficiency, Fractures); Future  Hot flashes, menopausal -     CBC with Differential/Platelet; Future -     Comprehensive metabolic panel with GFR; Future -     TSH; Future -     Estradiol ; Take 1 tablet (0.5 mg total) by mouth daily.  Dispense: 90 tablet; Refill: 0 -     Estradiol ; 1 applicatorful 1-3 days per week  Dispense: 42.5 g; Refill: 12  Encounter for hepatitis C screening test for low risk patient -     Hepatitis C antibody; Future  Encounter for lipid screening for cardiovascular disease -     Lipid panel; Future  Vaginal dryness Assessment & Plan: No personal or family history of breast cancer.  Trial of vaginal Estrace     Hormone replacement therapy (HRT) Assessment & Plan: Patient no longer deriving therapeutic benefit from HRT.  She is very interested in coming off this medication.  Discussed how to slowly wean off of HRT.   Instructions provided on AVS      Return precautions given.   Risks, benefits, and alternatives of the medications and treatment plan prescribed today were discussed, and patient expressed understanding.   Education regarding symptom management and diagnosis given to patient on AVS either electronically or printed.  Return for Fasting labs in 2-3 weeks, Complete Physical Exam.  [name], FNP  Subjective:    Patient ID: Elaine Graves, female    DOB: May 24, 1955, 68 y.o.   MRN: [id number]  CC: Elaine Graves is a 68 y.o. female who presents today to establish care.    HPI: Complains of vaginal dryness  Hot flashes have resolved. She has missed HRT doses without sides.  She is interested in coming off of HRT    She is no longer following with Dr Janit She is using clobetasol  twice weekly ; h/o lichen sclerosis. She follows with dermatology.       Osteoporosis - she is compliant with fosamax  started 07/2021 No planned dental procedures.   She walking , doing free weights. She has a Systems developer.    Exposure to DES; she is no longer screening for cervical cancer  05/16/19 pap smear negative HPV; NILM  Allergies: Patient has no known allergies. Current Outpatient Medications on File Prior to Visit  Medication Sig Dispense Refill   alendronate  (FOSAMAX ) 70 MG tablet TAKE ONE TABLET BY MOUTH EACH WEEK, ON AN EMPTY STOMACH BEFORE  BREAKFAST WITH 8oz OF WATER AND REMAIN UPRIGHT FOR :30 6 tablet 1   clobetasol  cream (TEMOVATE ) 0.05 % Apply 1 Application topically 2 (two) times a week. 60 g 1   medroxyPROGESTERone  (PROVERA ) 2.5 MG tablet TAKE 1 TABLET BY MOUTH ONCE DAILY 90 tablet 0   No current facility-administered medications on file prior to visit.    Review of Systems  Constitutional:  Negative for chills and fever.  Respiratory:  Negative for cough.   Cardiovascular:  Negative for chest pain and palpitations.  Gastrointestinal:  Negative for nausea and  vomiting.      Objective:    BP 124/76   Pulse 78   Temp 97.7 F (36.5 C) (Oral)   Ht 5' 5 (1.651 m)   Wt 172 lb 12.8 oz (78.4 kg)   SpO2 98%   BMI 28.76 kg/m  BP Readings from Last 3 Encounters:  10/05/23 124/76  05/24/22 132/78  05/20/21 116/78   Wt Readings from Last 3 Encounters:  10/05/23 172 lb 12.8 oz (78.4 kg)  05/24/22 167 lb 14.4 oz (76.2 kg)  05/20/21 192 lb 11.2 oz (87.4 kg)    Physical Exam Vitals reviewed.  Constitutional:      Appearance: She is well-developed.  Eyes:     Conjunctiva/sclera: Conjunctivae normal.  Cardiovascular:     Rate and Rhythm: Normal rate and regular rhythm.     Pulses: Normal pulses.     Heart sounds: Normal heart sounds.  Pulmonary:     Effort: Pulmonary effort is normal.     Breath sounds: Normal breath sounds. No wheezing, rhonchi or rales.  Skin:    General: Skin is warm and dry.  Neurological:     Mental Status: She is alert.  Psychiatric:        Speech: Speech normal.        Behavior: Behavior normal.        Thought Content: Thought content normal.

## 2023-10-09 ENCOUNTER — Encounter: Payer: Self-pay | Admitting: Family

## 2023-10-09 DIAGNOSIS — Z79890 Hormone replacement therapy (postmenopausal): Secondary | ICD-10-CM | POA: Insufficient documentation

## 2023-10-09 NOTE — Assessment & Plan Note (Signed)
 No personal or family history of breast cancer.  Trial of vaginal Estrace 

## 2023-10-09 NOTE — Assessment & Plan Note (Signed)
 Patient no longer deriving therapeutic benefit from HRT.  She is very interested in coming off this medication.  Discussed how to slowly wean off of HRT.  Instructions provided on AVS

## 2023-10-27 ENCOUNTER — Other Ambulatory Visit (INDEPENDENT_AMBULATORY_CARE_PROVIDER_SITE_OTHER)

## 2023-10-27 DIAGNOSIS — Z136 Encounter for screening for cardiovascular disorders: Secondary | ICD-10-CM

## 2023-10-27 DIAGNOSIS — M810 Age-related osteoporosis without current pathological fracture: Secondary | ICD-10-CM

## 2023-10-27 DIAGNOSIS — Z13220 Encounter for screening for lipoid disorders: Secondary | ICD-10-CM

## 2023-10-27 DIAGNOSIS — Z1159 Encounter for screening for other viral diseases: Secondary | ICD-10-CM

## 2023-10-27 DIAGNOSIS — N951 Menopausal and female climacteric states: Secondary | ICD-10-CM | POA: Diagnosis not present

## 2023-10-27 LAB — COMPREHENSIVE METABOLIC PANEL WITH GFR
ALT: 7 U/L (ref 0–35)
AST: 18 U/L (ref 0–37)
Albumin: 4 g/dL (ref 3.5–5.2)
Alkaline Phosphatase: 41 U/L (ref 39–117)
BUN: 15 mg/dL (ref 6–23)
CO2: 27 meq/L (ref 19–32)
Calcium: 8.9 mg/dL (ref 8.4–10.5)
Chloride: 105 meq/L (ref 96–112)
Creatinine, Ser: 0.83 mg/dL (ref 0.40–1.20)
GFR: 72.37 mL/min (ref >60.00)
Glucose, Bld: 85 mg/dL (ref 70–99)
Potassium: 4.2 meq/L (ref 3.5–5.1)
Sodium: 140 meq/L (ref 135–145)
Total Bilirubin: 0.7 mg/dL (ref 0.2–1.2)
Total Protein: 6.8 g/dL (ref 6.0–8.3)

## 2023-10-27 LAB — CBC WITH DIFFERENTIAL/PLATELET
Basophils Absolute: 0 K/uL (ref 0.0–0.1)
Basophils Relative: 0.8 % (ref 0.0–3.0)
Eosinophils Absolute: 0.1 K/uL (ref 0.0–0.7)
Eosinophils Relative: 2.7 % (ref 0.0–5.0)
HCT: 40.4 % (ref 36.0–46.0)
Hemoglobin: 13.5 g/dL (ref 12.0–15.0)
Lymphocytes Relative: 39.3 % (ref 12.0–46.0)
Lymphs Abs: 1.6 K/uL (ref 0.7–4.0)
MCHC: 33.5 g/dL (ref 30.0–36.0)
MCV: 101.6 fl — ABNORMAL HIGH (ref 78.0–100.0)
Monocytes Absolute: 0.4 K/uL (ref 0.1–1.0)
Monocytes Relative: 9.4 % (ref 3.0–12.0)
Neutro Abs: 1.9 K/uL (ref 1.4–7.7)
Neutrophils Relative %: 47.8 % (ref 43.0–77.0)
Platelets: 342 K/uL (ref 150.0–400.0)
RBC: 3.98 Mil/uL (ref 3.87–5.11)
RDW: 12.6 % (ref 11.5–15.5)
WBC: 4 K/uL (ref 4.0–10.5)

## 2023-10-27 LAB — LIPID PANEL
Cholesterol: 192 mg/dL (ref 0–200)
HDL: 63.9 mg/dL (ref >39.00)
LDL Cholesterol: 116 mg/dL — ABNORMAL HIGH (ref 0–99)
NonHDL: 127.91
Total CHOL/HDL Ratio: 3
Triglycerides: 61 mg/dL (ref 0.0–149.0)
VLDL: 12.2 mg/dL (ref 0.0–40.0)

## 2023-10-27 LAB — TSH: TSH: 4.11 u[IU]/mL (ref 0.35–5.50)

## 2023-10-27 LAB — VITAMIN D 25 HYDROXY (VIT D DEFICIENCY, FRACTURES): VITD: 27.88 ng/mL — ABNORMAL LOW (ref 30.00–100.00)

## 2023-10-28 LAB — HEPATITIS C ANTIBODY: Hepatitis C Ab: NONREACTIVE

## 2023-10-31 ENCOUNTER — Ambulatory Visit: Payer: Self-pay | Admitting: Family

## 2023-11-06 IMAGING — MG DIGITAL DIAGNOSTIC BILAT W/ TOMO W/ CAD
8 of 14 series · 8 of 38 positions shown · non-contrast
Comparison: Previous exam(s).

CLINICAL DATA: One year follow-up of left breast calcifications.

EXAM:
DIGITAL DIAGNOSTIC BILATERAL MAMMOGRAM WITH TOMOSYNTHESIS AND CAD
TECHNIQUE: Bilateral digital diagnostic mammography and breast tomosynthesis
was performed. The images were evaluated with computer-aided
detection.

[L CC]
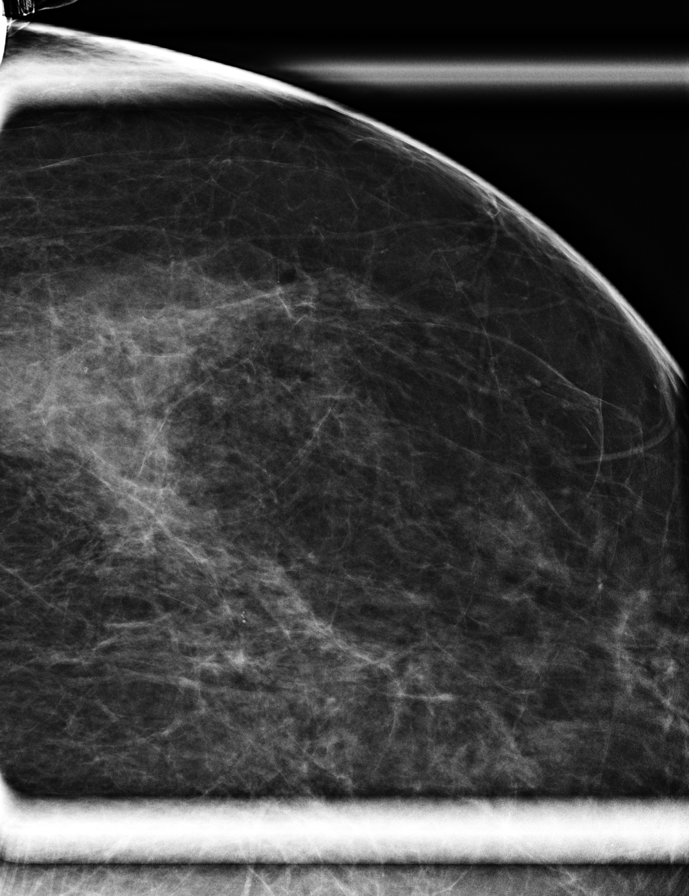

[L ML]
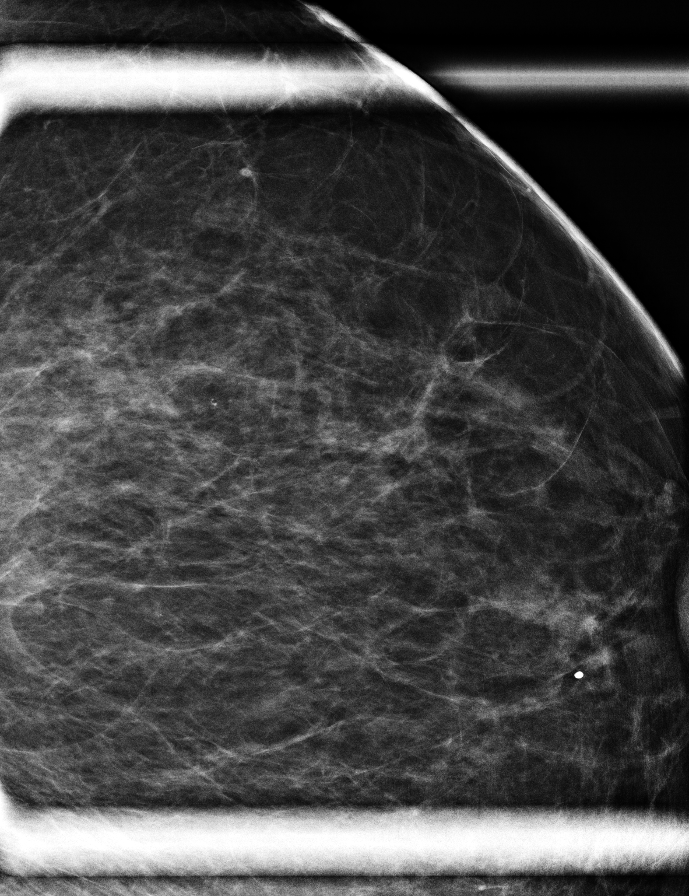

[L CC synth-2D]
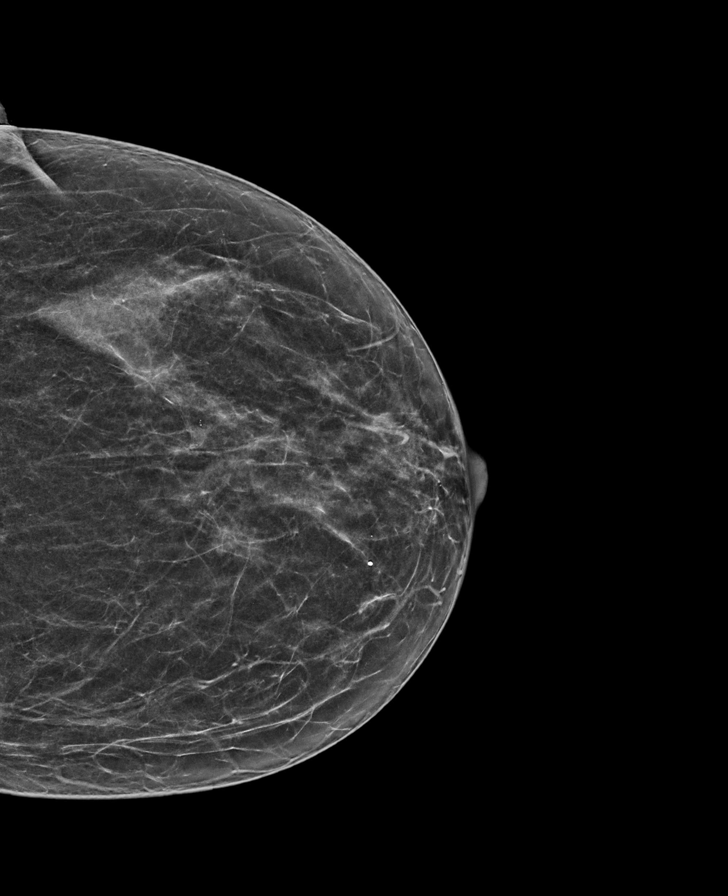

[R CC synth-2D (1 of 2)]
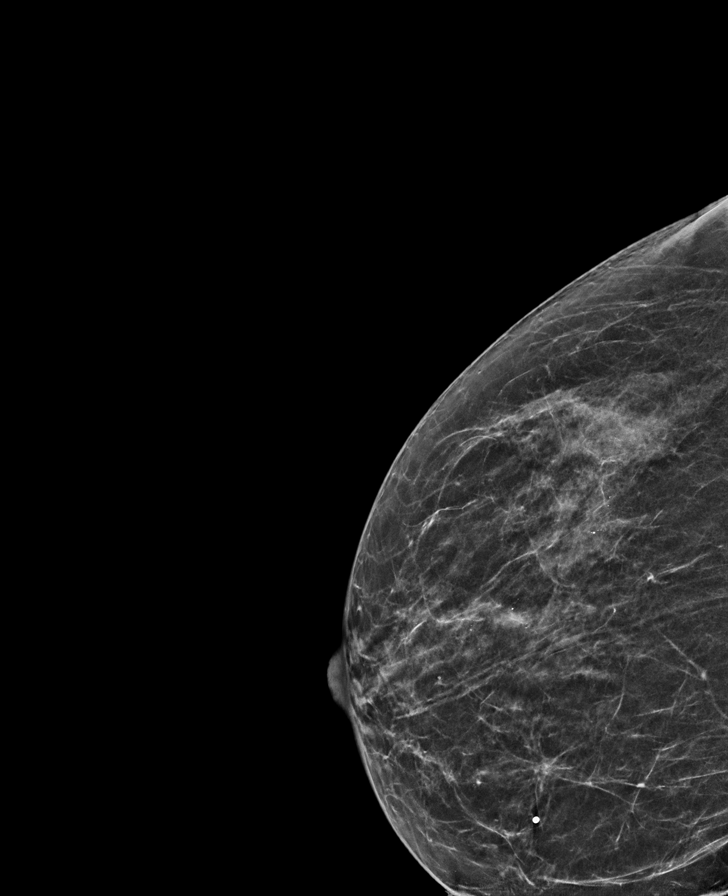

[L MLO synth-2D (1 of 2)]
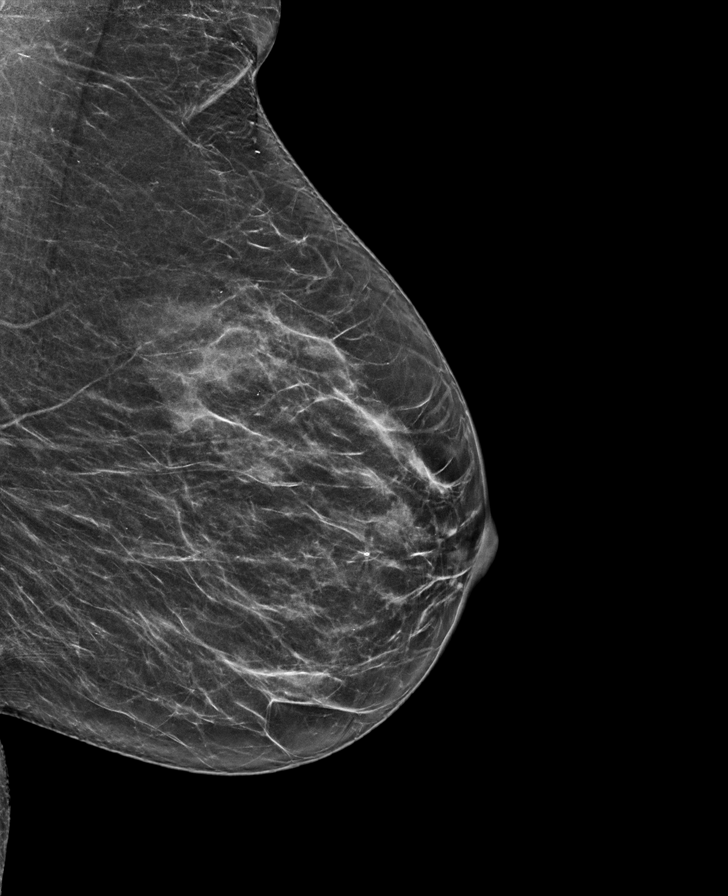

[R MLO synth-2D]
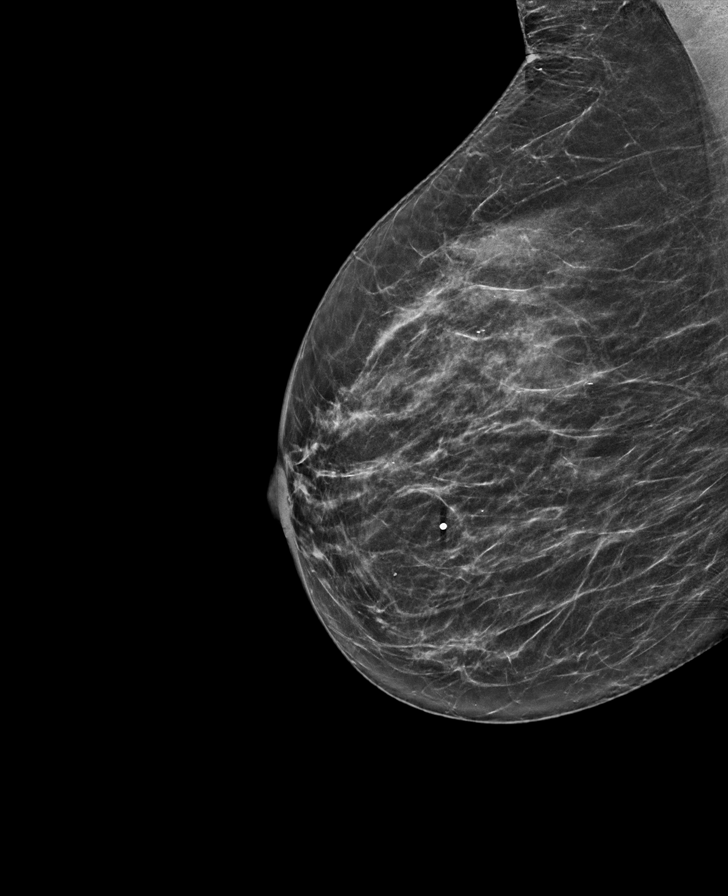

[R CC synth-2D (2 of 2)]
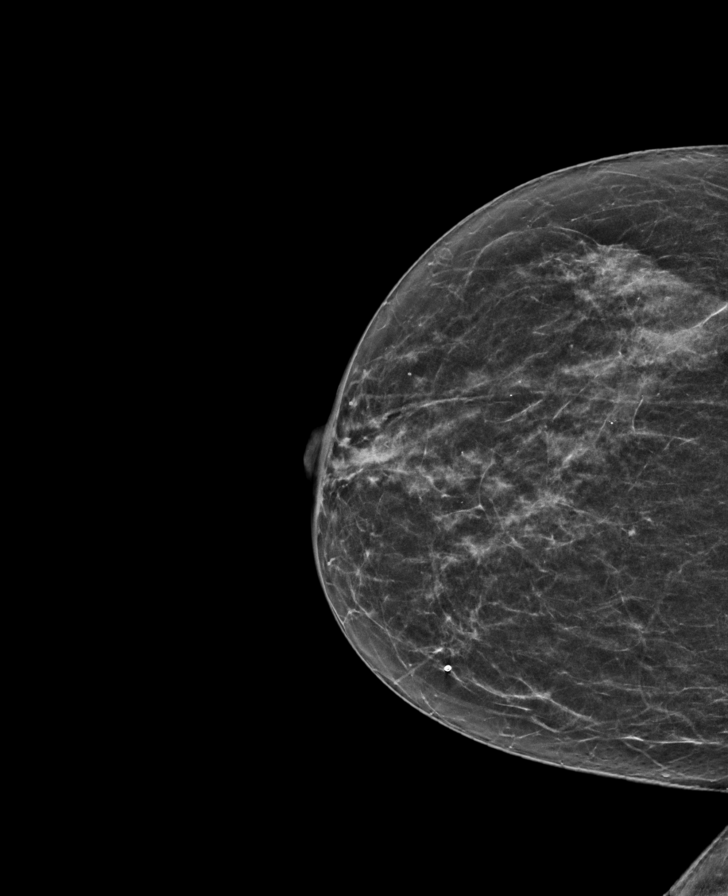

[L MLO synth-2D (2 of 2)]
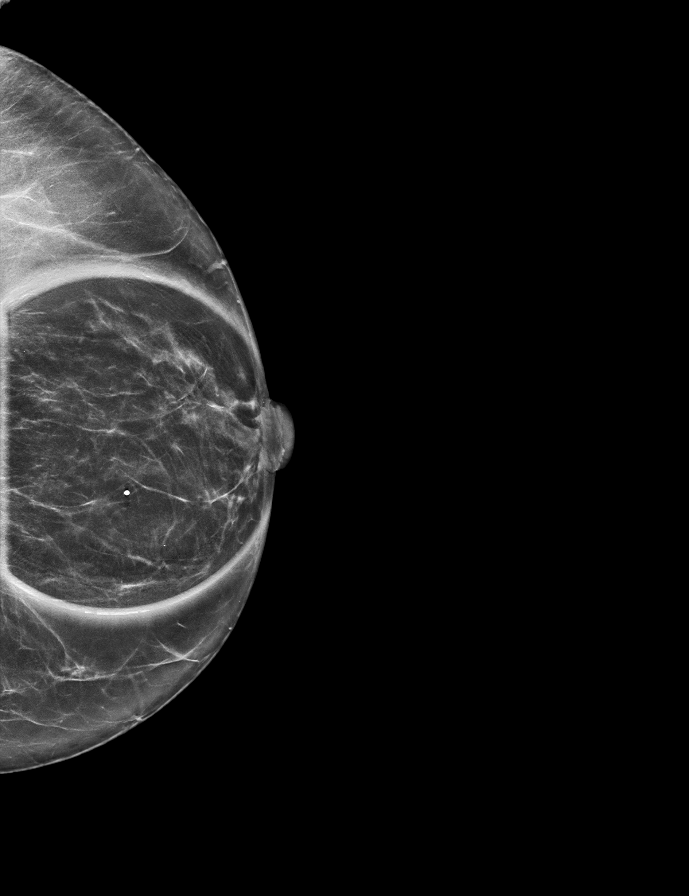

[8 of 38 positions shown; findings below may reference images not displayed]

ACR Breast Density Category b: There are scattered areas of
fibroglandular density.
FINDINGS: The left breast calcifications identified in the upper outer breast
are stable. No new or suspicious findings in either breast.
IMPRESSION: Stable probably benign left breast calcifications. No other evidence
of malignancy.

RECOMMENDATION:
Twelve month follow-up mammography of the left breast
calcifications. The patient will be due for bilateral mammography at
that time.

I have discussed the findings and recommendations with the patient.
If applicable, a reminder letter will be sent to the patient
regarding the next appointment.

BI-RADS CATEGORY  3: Probably benign.

## 2023-11-22 ENCOUNTER — Encounter: Payer: Self-pay | Admitting: Family

## 2023-11-22 ENCOUNTER — Ambulatory Visit (INDEPENDENT_AMBULATORY_CARE_PROVIDER_SITE_OTHER): Admitting: Family

## 2023-11-22 VITALS — BP 122/70 | HR 93 | Temp 97.7°F | Ht 65.0 in | Wt 173.6 lb

## 2023-11-22 DIAGNOSIS — L900 Lichen sclerosus et atrophicus: Secondary | ICD-10-CM

## 2023-11-22 DIAGNOSIS — Z0000 Encounter for general adult medical examination without abnormal findings: Secondary | ICD-10-CM | POA: Diagnosis not present

## 2023-11-22 DIAGNOSIS — Z1231 Encounter for screening mammogram for malignant neoplasm of breast: Secondary | ICD-10-CM

## 2023-11-22 DIAGNOSIS — Z780 Asymptomatic menopausal state: Secondary | ICD-10-CM | POA: Diagnosis not present

## 2023-11-22 DIAGNOSIS — Z9189 Other specified personal risk factors, not elsewhere classified: Secondary | ICD-10-CM

## 2023-11-22 DIAGNOSIS — Z1211 Encounter for screening for malignant neoplasm of colon: Secondary | ICD-10-CM

## 2023-11-22 NOTE — Assessment & Plan Note (Signed)
 Clinical breast exam performed.  Colonoscopy referral has been placed.  Congratulated patient on diligence to exercise.  She politely declines influenza, pneumonia vaccine today.  Discussed CT calcium score as a test to further stratify underlying cardiovascular risk.  Discussed ASCVD risk.  She will consider this test and let me know if she would like to pursue.

## 2023-11-22 NOTE — Progress Notes (Signed)
 Assessment & Plan:  Annual physical exam Assessment & Plan: Clinical breast exam performed.  Colonoscopy referral has been placed.  Congratulated patient on diligence to exercise.  She politely declines influenza, pneumonia vaccine today.  Discussed CT calcium score as a test to further stratify underlying cardiovascular risk.  Discussed ASCVD risk.  She will consider this test and let me know if she would like to pursue.   Orders: -     Ambulatory referral to Gastroenterology -     3D Screening Mammogram, Left and Right; Future -     DG Bone Density; Future  Lichen sclerosus  Encounter for screening mammogram for malignant neoplasm of breast -     3D Screening Mammogram, Left and Right; Future  Asymptomatic postmenopausal state -     DG Bone Density; Future  Screen for colon cancer -     Ambulatory referral to Gastroenterology  DES exposure in utero Assessment & Plan:  Last Pap smear 05/16/2019, negative HPV, negative malignancy. 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 At that point, as long as reassuring, most likely we will discontinue cervical cancer screening .      Return precautions given.   Risks, benefits, and alternatives of the medications and treatment plan prescribed today were discussed, and patient expressed understanding.   Education regarding symptom management and diagnosis given to patient on AVS either electronically or printed.  Return for Complete Physical Exam.  [name], FNP  Subjective:    Patient ID: Elaine Graves, female    DOB: 1955/11/21, 68 y.o.   MRN: [id number]  CC: Elaine Graves is a 68 y.o. female who presents today for physical exam.    HPI: Feels well today.  No new complaints.   No family h/o ASCVD.  Denies CP, sob, fatigue  Colorectal Cancer Screening: due 03/22/17 Dr Dessa; repeat in 5 years Breast Cancer Screening: Mammogram  UTD Cervical Cancer Screening: Last Pap smear 05/16/2019, negative HPV, negative malignancy; h/o DES exposure  Bone Health screening/DEXA for 65+: due h/o osteoporosis  Lung Cancer Screening: Doesn't have 20 year pack year history and age > 40 years yo 58 years          Tetanus - due        Pneumococcal - Candidate for.  She politely declines  Exercise: Gets regular exercise, walking 5 days per week, 30 minutes; and free weights.     Alcohol use:  on the weekends Smoking/tobacco use: Nonsmoker.    Health Maintenance  Topic Date Due   Medicare Annual Wellness Visit  Never done   DTaP/Tdap/Td vaccine (1 - Tdap) Never done   COVID-19 Vaccine (4 - 2025-26 season) 11/06/2023   Flu Shot  06/04/2024*   Pneumococcal Vaccine for age over 85 (1 of 1 - PCV) 11/21/2024*   Breast Cancer Screening  08/15/2025   Colon Cancer Screening  03/23/2027   DEXA scan (bone density measurement)  Completed   Hepatitis C Screening  Completed   Zoster (Shingles) Vaccine  Completed   HPV Vaccine  Aged Out   Meningitis B Vaccine  Aged Out  *Topic was postponed. The date shown is not the original due date.    ALLERGIES: Patient has no known allergies.  Current Outpatient Medications on File Prior to Visit  Medication Sig Dispense Refill   alendronate  (FOSAMAX ) 70 MG tablet TAKE ONE TABLET BY MOUTH EACH  WEEK, ON AN EMPTY STOMACH BEFORE BREAKFAST WITH 8oz OF WATER AND REMAIN UPRIGHT FOR :30 6 tablet 1   clobetasol  cream (TEMOVATE ) 0.05 % Apply 1 Application topically 2 (two) times a week. 60 g 1   estradiol  (ESTRACE  VAGINAL) 0.1 MG/GM vaginal cream 1 applicatorful 1-3 days per week 42.5 g 12   estradiol  (ESTRACE ) 0.5 MG tablet Take 1 tablet (0.5 mg total) by mouth daily. 90 tablet 0   medroxyPROGESTERone  (PROVERA ) 2.5 MG tablet TAKE 1 TABLET BY MOUTH ONCE DAILY 90 tablet 0   No current facility-administered medications on file prior to visit.    Review of Systems  Constitutional:  Negative for chills,  fever and unexpected weight change.  HENT:  Negative for congestion.   Respiratory:  Negative for cough.   Cardiovascular:  Negative for chest pain, palpitations and leg swelling.  Gastrointestinal:  Negative for nausea and vomiting.  Musculoskeletal:  Negative for arthralgias and myalgias.  Skin:  Negative for rash.  Neurological:  Negative for headaches.  Hematological:  Negative for adenopathy.  Psychiatric/Behavioral:  Negative for confusion.       Objective:    BP 122/70   Pulse 93   Temp 97.7 F (36.5 C) (Oral)   Ht 5' 5 (1.651 m)   Wt 173 lb 9.6 oz (78.7 kg)   SpO2 97%   BMI 28.89 kg/m   BP Readings from Last 3 Encounters:  11/22/23 122/70  10/05/23 124/76  05/24/22 132/78   Wt Readings from Last 3 Encounters:  11/22/23 173 lb 9.6 oz (78.7 kg)  10/05/23 172 lb 12.8 oz (78.4 kg)  05/24/22 167 lb 14.4 oz (76.2 kg)    Physical Exam Vitals reviewed.  Constitutional:      Appearance: Normal appearance. She is well-developed.  Eyes:     Conjunctiva/sclera: Conjunctivae normal.  Neck:     Thyroid: No thyroid mass or thyromegaly.  Cardiovascular:     Rate and Rhythm: Normal rate and regular rhythm.     Pulses: Normal pulses.     Heart sounds: Normal heart sounds.  Pulmonary:     Effort: Pulmonary effort is normal.     Breath sounds: Normal breath sounds. No wheezing, rhonchi or rales.  Abdominal:     General: Bowel sounds are normal. There is no distension.     Palpations: Abdomen is soft. Abdomen is not rigid. There is no fluid wave or mass.     Tenderness: There is no abdominal tenderness. There is no guarding or rebound.  Lymphadenopathy:     Head:     Right side of head: No submental, submandibular, tonsillar, preauricular, posterior auricular or occipital adenopathy.     Left side of head: No submental, submandibular, tonsillar, preauricular, posterior auricular or occipital adenopathy.     Cervical: No cervical adenopathy.  Skin:    General: Skin is  warm and dry.  Neurological:     Mental Status: She is alert.  Psychiatric:        Speech: Speech normal.        Behavior: Behavior normal.        Thought Content: Thought content normal.

## 2023-11-22 NOTE — Patient Instructions (Signed)
 Health Maintenance for Postmenopausal Women Menopause is a normal process in which your ability to get pregnant comes to an end. This process happens slowly over many months or years, usually between the ages of 76 and 38. Menopause is complete when you have missed your menstrual period for 12 months. It is important to talk with your health care provider about some of the most common conditions that affect women after menopause (postmenopausal women). These include heart disease, cancer, and bone loss (osteoporosis). Adopting a healthy lifestyle and getting preventive care can help to promote your health and wellness. The actions you take can also lower your chances of developing some of these common conditions. What are the signs and symptoms of menopause? During menopause, you may have the following symptoms: Hot flashes. These can be moderate or severe. Night sweats. Decrease in sex drive. Mood swings. Headaches. Tiredness (fatigue). Irritability. Memory problems. Problems falling asleep or staying asleep. Talk with your health care provider about treatment options for your symptoms. Do I need hormone replacement therapy? Hormone replacement therapy is effective in treating symptoms that are caused by menopause, such as hot flashes and night sweats. Hormone replacement carries certain risks, especially as you become older. If you are thinking about using estrogen or estrogen with progestin, discuss the benefits and risks with your health care provider. How can I reduce my risk for heart disease and stroke? The risk of heart disease, heart attack, and stroke increases as you age. One of the causes may be a change in the body's hormones during menopause. This can affect how your body uses dietary fats, triglycerides, and cholesterol. Heart attack and stroke are medical emergencies. There are many things that you can do to help prevent heart disease and stroke. Watch your blood pressure High  blood pressure causes heart disease and increases the risk of stroke. This is more likely to develop in people who have high blood pressure readings or are overweight. Have your blood pressure checked: Every 3-5 years if you are 32-23 years of age. Every year if you are 31 years old or older. Eat a healthy diet  Eat a diet that includes plenty of vegetables, fruits, low-fat dairy products, and lean protein. Do not eat a lot of foods that are high in solid fats, added sugars, or sodium. Get regular exercise Get regular exercise. This is one of the most important things you can do for your health. Most adults should: Try to exercise for at least 150 minutes each week. The exercise should increase your heart rate and make you sweat (moderate-intensity exercise). Try to do strengthening exercises at least twice each week. Do these in addition to the moderate-intensity exercise. Spend less time sitting. Even light physical activity can be beneficial. Other tips Work with your health care provider to achieve or maintain a healthy weight. Do not use any products that contain nicotine or tobacco. These products include cigarettes, chewing tobacco, and vaping devices, such as e-cigarettes. If you need help quitting, ask your health care provider. Know your numbers. Ask your health care provider to check your cholesterol and your blood sugar (glucose). Continue to have your blood tested as directed by your health care provider. Do I need screening for cancer? Depending on your health history and family history, you may need to have cancer screenings at different stages of your life. This may include screening for: Breast cancer. Cervical cancer. Lung cancer. Colorectal cancer. What is my risk for osteoporosis? After menopause, you may be  at increased risk for osteoporosis. Osteoporosis is a condition in which bone destruction happens more quickly than new bone creation. To help prevent osteoporosis or  the bone fractures that can happen because of osteoporosis, you may take the following actions: If you are 24-54 years old, get at least 1,000 mg of calcium and at least 600 international units (IU) of vitamin D  per day. If you are older than age 75 but younger than age 30, get at least 1,200 mg of calcium and at least 600 international units (IU) of vitamin D  per day. If you are older than age 8, get at least 1,200 mg of calcium and at least 800 international units (IU) of vitamin D  per day. Smoking and drinking excessive alcohol increase the risk of osteoporosis. Eat foods that are rich in calcium and vitamin D , and do weight-bearing exercises several times each week as directed by your health care provider. How does menopause affect my mental health? Depression may occur at any age, but it is more common as you become older. Common symptoms of depression include: Feeling depressed. Changes in sleep patterns. Changes in appetite or eating patterns. Feeling an overall lack of motivation or enjoyment of activities that you previously enjoyed. Frequent crying spells. Talk with your health care provider if you think that you are experiencing any of these symptoms. General instructions See your health care provider for regular wellness exams and vaccines. This may include: Scheduling regular health, dental, and eye exams. Getting and maintaining your vaccines. These include: Influenza vaccine. Get this vaccine each year before the flu season begins. Pneumonia vaccine. Shingles vaccine. Tetanus, diphtheria, and pertussis (Tdap) booster vaccine. Your health care provider may also recommend other immunizations. Tell your health care provider if you have ever been abused or do not feel safe at home. Summary Menopause is a normal process in which your ability to get pregnant comes to an end. This condition causes hot flashes, night sweats, decreased interest in sex, mood swings, headaches, or lack  of sleep. Treatment for this condition may include hormone replacement therapy. Take actions to keep yourself healthy, including exercising regularly, eating a healthy diet, watching your weight, and checking your blood pressure and blood sugar levels. Get screened for cancer and depression. Make sure that you are up to date with all your vaccines. This information is not intended to replace advice given to you by your health care provider. Make sure you discuss any questions you have with your health care provider. Document Revised: 07/13/2020 Document Reviewed: 07/13/2020 Elsevier Patient Education  2024 [employer].

## 2023-11-22 NOTE — Assessment & Plan Note (Signed)
 Last Pap smear 05/16/2019, negative HPV, negative malignancy. 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 At that point, as long as reassuring, most likely we will discontinue cervical cancer screening .

## 2023-12-01 ENCOUNTER — Encounter: Payer: Self-pay | Admitting: Family

## 2023-12-01 DIAGNOSIS — M810 Age-related osteoporosis without current pathological fracture: Secondary | ICD-10-CM

## 2023-12-04 ENCOUNTER — Other Ambulatory Visit: Payer: Self-pay | Admitting: Family

## 2023-12-04 DIAGNOSIS — M810 Age-related osteoporosis without current pathological fracture: Secondary | ICD-10-CM

## 2023-12-04 MED ORDER — ALENDRONATE SODIUM 70 MG PO TABS: 70.0000 mg | ORAL_TABLET | ORAL | 3 refills | Status: AC

## 2023-12-04 NOTE — Telephone Encounter (Unsigned)
 Copied from CRM #[id number]. Topic: Clinical - Medication Refill >> Dec 04, 2023  9:06 AM [name] wrote: Medication:  alendronate  (FOSAMAX ) 70 MG tablet  Has the patient contacted their pharmacy? Yes (Agent: If no, request that the patient contact the pharmacy for the refill. If patient does not wish to contact the pharmacy document the reason why and proceed with request.) (Agent: If yes, when and what did the pharmacy advise?)  This is the patient's preferred pharmacy:  [facility] - [place], [place] - [address] [place] [address] Phone: [phone] Fax: [phone]  Is this the correct pharmacy for this prescription? Yes If no, delete pharmacy and type the correct one.   Has the prescription been filled recently? No  Is the patient out of the medication? Yes  Has the patient been seen for an appointment in the last year OR does the patient have an upcoming appointment? Yes  Can we respond through MyChart? Yes  Agent: Please be advised that Rx refills may take up to 3 business days. We ask that you follow-up with your pharmacy.

## 2023-12-08 ENCOUNTER — Ambulatory Visit (INDEPENDENT_AMBULATORY_CARE_PROVIDER_SITE_OTHER): Admitting: *Deleted

## 2023-12-08 VITALS — Ht 65.0 in | Wt 160.0 lb

## 2023-12-08 DIAGNOSIS — Z0000 Encounter for general adult medical examination without abnormal findings: Secondary | ICD-10-CM

## 2023-12-08 NOTE — Patient Instructions (Signed)
 Elaine Graves,  Thank you for taking the time for your Medicare Wellness Visit. I appreciate your continued commitment to your health goals. Please review the care plan we discussed, and feel free to reach out if I can assist you further.  Medicare recommends these wellness visits once per year to help you and your care team stay ahead of potential health issues. These visits are designed to focus on prevention, allowing your provider to concentrate on managing your acute and chronic conditions during your regular appointments.  Please note that Annual Wellness Visits do not include a physical exam. Some assessments may be limited, especially if the visit was conducted virtually. If needed, we may recommend a separate in-person follow-up with your provider.  Ongoing Care Seeing your primary care provider every 3 to 6 months helps us  monitor your health and provide consistent, personalized care.  Consider updating your vaccines. Make sure that you call and schedule your mammogram and Dexa when they are due.   Referrals If a referral was made during today's visit and you haven't received any updates within two weeks, please contact the referred provider directly to check on the status.  Recommended Screenings:  Health Maintenance  Topic Date Due   DTaP/Tdap/Td vaccine (1 - Tdap) Never done   COVID-19 Vaccine (4 - 2025-26 season) 11/06/2023   Flu Shot  06/04/2024*   Pneumococcal Vaccine for age over 79 (1 of 1 - PCV) 11/21/2024*   Medicare Annual Wellness Visit  12/07/2024   Breast Cancer Screening  08/15/2025   Colon Cancer Screening  03/23/2027   DEXA scan (bone density measurement)  Completed   Hepatitis C Screening  Completed   Zoster (Shingles) Vaccine  Completed   HPV Vaccine  Aged Out   Meningitis B Vaccine  Aged Out  *Topic was postponed. The date shown is not the original due date.       12/08/2023   10:20 AM  Advanced Directives  Does Patient Have a Medical Advance  Directive? No  Would patient like information on creating a medical advance directive? No - Patient declined   Advance Care Planning is important because it: Ensures you receive medical care that aligns with your values, goals, and preferences. Provides guidance to your family and loved ones, reducing the emotional burden of decision-making during critical moments.  Vision: Annual vision screenings are recommended for early detection of glaucoma, cataracts, and diabetic retinopathy. These exams can also reveal signs of chronic conditions such as diabetes and high blood pressure.  Dental: Annual dental screenings help detect early signs of oral cancer, gum disease, and other conditions linked to overall health, including heart disease and diabetes.  Please see the attached documents for additional preventive care recommendations.

## 2023-12-08 NOTE — Progress Notes (Signed)
 Subjective:   Elaine Graves is a 68 y.o. who presents for a Medicare Wellness preventive visit.  As a reminder, Annual Wellness Visits don't include a physical exam, and some assessments may be limited, especially if this visit is performed virtually. We may recommend an in-person follow-up visit with your provider if needed.  Visit Complete: Virtual I connected with  GUY TONEY on 12/08/23 by a video and audio enabled telemedicine application and verified that I am speaking with the correct person using two identifiers.  Patient Location: Home  Provider Location: Home Office  I discussed the limitations of evaluation and management by telemedicine. The patient expressed understanding and agreed to proceed.  Vital Signs: Because this visit was a virtual/telehealth visit, some criteria may be missing or patient reported. Any vitals not documented were not able to be obtained and vitals that have been documented are patient reported.    Persons Participating in Visit: Patient.  AWV Questionnaire: Yes: Patient Medicare AWV questionnaire was completed by the patient on 12/07/23; I have confirmed that all information answered by patient is correct and no changes since this date.  Cardiac Risk Factors include: advanced age (>58men, >90 women)     Objective:    Today's Vitals   12/08/23 1008  Weight: 160 lb (72.6 kg)  Height: 5' 5 (1.651 m)   Body mass index is 26.63 kg/m.     12/08/2023   10:20 AM 05/22/2017    8:12 AM 03/22/2017    9:58 AM  Advanced Directives  Does Patient Have a Medical Advance Directive? No Yes  No   Type of Advance Directive  Living will;Healthcare Power of Attorney   Does patient want to make changes to medical advance directive?  Yes (Inpatient - patient requests chaplain consult to change a medical advance directive)    Copy of Healthcare Power of Attorney in Chart?  No - copy requested    Would patient like information on creating a medical  advance directive? No - Patient declined  No - Patient declined      Data saved with a previous flowsheet row definition    Current Medications (verified) Outpatient Encounter Medications as of 12/08/2023  Medication Sig   alendronate  (FOSAMAX ) 70 MG tablet Take 1 tablet (70 mg total) by mouth once a week. Take with a full glass of water on an empty stomach.   Calcium Carb-Cholecalciferol (CALCIUM-VITAMIN D  PO) Take by mouth 2 (two) times daily. (Patient taking differently: Take by mouth 2 (two) times daily. Take two twice a day)   clobetasol  cream (TEMOVATE ) 0.05 % Apply 1 Application topically 2 (two) times a week. (Patient taking differently: Apply 1 Application topically 2 (two) times a week. As needed)   estradiol  (ESTRACE  VAGINAL) 0.1 MG/GM vaginal cream 1 applicatorful 1-3 days per week   estradiol  (ESTRACE ) 0.5 MG tablet Take 1 tablet (0.5 mg total) by mouth daily.   medroxyPROGESTERone  (PROVERA ) 2.5 MG tablet TAKE 1 TABLET BY MOUTH ONCE DAILY   No facility-administered encounter medications on file as of 12/08/2023.    Allergies (verified) Patient has no known allergies.   History: Past Medical History:  Diagnosis Date   Anal fissure    Unspecified hemorrhoids without mention of complication 2012   Past Surgical History:  Procedure Laterality Date   BUNIONECTOMY Right    COLONOSCOPY  2010,01/28/2012   COLONOSCOPY WITH PROPOFOL  N/A 03/22/2017   Procedure: COLONOSCOPY WITH PROPOFOL ;  Surgeon: Dessa Reyes ORN, MD;  Location: [facility] ENDOSCOPY;  Service: Endoscopy;  Laterality: N/A;   HEMORRHOIDECTOMY WITH HEMORRHOID BANDING  2004   ROTATOR CUFF REPAIR  2006   Family History  Problem Relation Age of Onset   Colon cancer Mother 75   Colitis Mother    Prostate cancer Father    Breast cancer Neg Hx    Ovarian cancer Neg Hx    Diabetes Neg Hx    Heart disease Neg Hx    Social History   Socioeconomic History   Marital status: Married    Spouse name: Not on file    Number of children: Not on file   Years of education: Not on file   Highest education level: Bachelor's degree (e.g., BA, AB, BS)  Occupational History   Not on file  Tobacco Use   Smoking status: Never   Smokeless tobacco: Never  Vaping Use   Vaping status: Never Used  Substance and Sexual Activity   Alcohol use: Yes    Comment: weekend   Drug use: No   Sexual activity: Yes    Birth control/protection: Post-menopausal  Other Topics Concern   Not on file  Social History Narrative   Married 47 years   3 children 59, 4, 55   7 grandchildren    Works at [employer]   Social Drivers of [employer]: Low Risk  (12/08/2023)   Overall Financial Resource Strain (CARDIA)    Difficulty of Paying Living Expenses: Not hard at all  Food Insecurity: No Food Insecurity (12/08/2023)   Hunger Vital Sign    Worried About Running Out of Food in the Last Year: Never true    Ran Out of Food in the Last Year: Never true  Transportation Needs: No Transportation Needs (12/08/2023)   PRAPARE - Administrator, Civil Service (Medical): No    Lack of Transportation (Non-Medical): No  Physical Activity: Sufficiently Active (12/08/2023)   Exercise Vital Sign    Days of Exercise per Week: 5 days    Minutes of Exercise per Session: 40 min  Stress: No Stress Concern Present (12/08/2023)   [employer] of Occupational Health - Occupational Stress Questionnaire    Feeling of Stress: Only a little  Social Connections: Socially Integrated (12/08/2023)   Social Connection and Isolation Panel    Frequency of Communication with Friends and Family: More than three times a week    Frequency of Social Gatherings with Friends and Family: More than three times a week    Attends Religious Services: More than 4 times per year    Active Member of [employer] or Organizations: Yes    Attends Engineer, structural: More than 4 times per year    Marital Status: Married     Tobacco Counseling Counseling given: Not Answered    Clinical Intake:  Pre-visit preparation completed: Yes  Pain : No/denies pain     BMI - recorded: 26.63 Nutritional Status: BMI 25 -29 Overweight Nutritional Risks: None Diabetes: No  Lab Results  Component Value Date   HGBA1C 5.2 05/24/2022   HGBA1C 5.0 05/20/2021   HGBA1C 5.1 05/20/2020     How often do you need to have someone help you when you read instructions, pamphlets, or other written materials from your doctor or pharmacy?: 1 - Never  Interpreter Needed?: No  Information entered by :: R. Machell Wirthlin LPN   Activities of Daily Living     12/07/2023    3:20 PM  In your present state of health,  do you have any difficulty performing the following activities:  Hearing? 0  Vision? 0  Difficulty concentrating or making decisions? 0  Walking or climbing stairs? 0  Dressing or bathing? 0  Doing errands, shopping? 0  Preparing Food and eating ? N  Using the Toilet? N  In the past six months, have you accidently leaked urine? N  Do you have problems with loss of bowel control? N  Managing your Medications? N  Managing your Finances? N  Housekeeping or managing your Housekeeping? N    Patient Care Team: Dineen Rollene MATSU, FNP as PCP - General (Family Medicine)  I have updated your Care Teams any recent Medical Services you may have received from other providers in the past year.     Assessment:   This is a routine wellness examination for Datha.  Hearing/Vision screen Hearing Screening - Comments:: No issues Vision Screening - Comments:: readers   Goals Addressed             This Visit's Progress    Patient Stated       Wants to spend more time reading        Depression Screen     12/08/2023   10:15 AM 11/22/2023    8:17 AM 10/05/2023    1:57 PM  PHQ 2/9 Scores  PHQ - 2 Score 0 0 0  PHQ- 9 Score 0 0 0    Fall Risk     12/07/2023    3:20 PM 11/22/2023    8:17 AM 10/05/2023    1:57 PM   Fall Risk   Falls in the past year? 0 0 0  Number falls in past yr: 0 0 0  Injury with Fall? 0 0 0  Risk for fall due to : No Fall Risks No Fall Risks No Fall Risks  Follow up Falls evaluation completed;Falls prevention discussed Falls evaluation completed Falls evaluation completed    MEDICARE RISK AT HOME:  Medicare Risk at Home Any stairs in or around the home?: (Patient-Rptd) Yes If so, are there any without handrails?: (Patient-Rptd) No Home free of loose throw rugs in walkways, pet beds, electrical cords, etc?: (Patient-Rptd) Yes Adequate lighting in your home to reduce risk of falls?: (Patient-Rptd) Yes Life alert?: (Patient-Rptd) No Use of a cane, walker or w/c?: (Patient-Rptd) No Grab bars in the bathroom?: (Patient-Rptd) Yes Shower chair or bench in shower?: (Patient-Rptd) No Elevated toilet seat or a handicapped toilet?: (Patient-Rptd) No  TIMED UP AND GO:  Was the test performed?  No  Cognitive Function: 6CIT completed        Immunizations Immunization History  Administered Date(s) Administered   Influenza,inj,Quad PF,6+ Mos 12/22/2015, 02/22/2017   Moderna Sars-Covid-2 Vaccination 05/17/2019, 06/20/2019    Screening Tests Health Maintenance  Topic Date Due   Medicare Annual Wellness (AWV)  Never done   DTaP/Tdap/Td (1 - Tdap) Never done   COVID-19 Vaccine (4 - 2025-26 season) 11/06/2023   Influenza Vaccine  06/04/2024 (Originally 10/06/2023)   Pneumococcal Vaccine: 50+ Years (1 of 1 - PCV) 11/21/2024 (Originally 04/08/2005)   Mammogram  08/15/2025   Colonoscopy  03/23/2027   DEXA SCAN  Completed   Hepatitis C Screening  Completed   Zoster Vaccines- Shingrix  Completed   HPV VACCINES  Aged Out   Meningococcal B Vaccine  Aged Out    Health Maintenance Items Addressed: Patient declines vaccines. Patient is aware that orders have been placed for her mammogram and Dexa that are due next year  Additional Screening:  Vision Screening: Recommended annual  ophthalmology exams for early detection of glaucoma and other disorders of the eye. Is the patient up to date with their annual eye exam?  Yes  Who is the provider or what is the name of the office in which the patient attends annual eye exams?  [facility]  Dental Screening: Recommended annual dental exams for proper oral hygiene  Community Resource Referral / Chronic Care Management: CRR required this visit?  No   CCM required this visit?  No   Plan:    I have personally reviewed and noted the following in the patient's chart:   Medical and social history Use of alcohol, tobacco or illicit drugs  Current medications and supplements including opioid prescriptions. Patient is not currently taking opioid prescriptions. Functional ability and status Nutritional status Physical activity Advanced directives List of other physicians Hospitalizations, surgeries, and ER visits in previous 12 months Vitals Screenings to include cognitive, depression, and falls Referrals and appointments  In addition, I have reviewed and discussed with patient certain preventive protocols, quality metrics, and best practice recommendations. A written personalized care plan for preventive services as well as general preventive health recommendations were provided to patient.   Angeline Fredericks, LPN   89/08/7972   After Visit Summary: (MyChart) Due to this being a telephonic visit, the after visit summary with patients personalized plan was offered to patient via MyChart   Notes: Nothing significant to report at this time.

## 2023-12-20 ENCOUNTER — Encounter: Payer: Self-pay | Admitting: Family

## 2024-01-01 ENCOUNTER — Telehealth: Admitting: Family

## 2024-01-01 ENCOUNTER — Encounter: Payer: Self-pay | Admitting: Family

## 2024-01-01 VITALS — Ht 65.0 in | Wt 160.0 lb

## 2024-01-01 DIAGNOSIS — G8929 Other chronic pain: Secondary | ICD-10-CM

## 2024-01-01 DIAGNOSIS — R351 Nocturia: Secondary | ICD-10-CM | POA: Diagnosis not present

## 2024-01-01 DIAGNOSIS — M25562 Pain in left knee: Secondary | ICD-10-CM

## 2024-01-01 NOTE — Progress Notes (Unsigned)
 Virtual Visit via Video Note  I connected with ANANYA MCCLEESE on 01/02/24 at  1:30 PM EDT by a video enabled telemedicine application and verified that I am speaking with the correct person using two identifiers. Location patient: home Location provider: work  Persons participating in the virtual visit: patient, provider  I discussed the limitations of evaluation and management by telemedicine and the availability of in person appointments. The patient expressed understanding and agreed to proceed.  HPI:  Discussed the use of AI scribe software for clinical note transcription with the patient, who gave verbal consent to proceed.  History of Present Illness   VENA BASSINGER is a 68 year old female who presents with nocturia and knee pain.  She experiences nocturia that disrupts her sleep without associated urgency, urinary incontinence, or stress incontinence during the day. Denies gross hematuria, dysuria, fever.  She is mindful of her fluid intake in the evening and denies snoring. She does not feel any bladder distention.  She has left knee pain, with arthritic changes noted in a previous x-ray from 2018. She uses  Aleve for pain management.    X-ray left knee 03/28/2026 with mild degenerative changes.  ROS: See pertinent positives and negatives per HPI.  EXAM:  VITALS per patient if applicable: Ht 5' 5 (1.651 m)   Wt 160 lb (72.6 kg)   BMI 26.63 kg/m  BP Readings from Last 3 Encounters:  11/22/23 122/70  10/05/23 124/76  05/24/22 132/78   Wt Readings from Last 3 Encounters:  01/01/24 160 lb (72.6 kg)  12/08/23 160 lb (72.6 kg)  11/22/23 173 lb 9.6 oz (78.7 kg)    GENERAL: alert, oriented, appears well and in no acute distress  HEENT: atraumatic, conjunttiva clear, no obvious abnormalities on inspection of external nose and ears  NECK: normal movements of the head and neck  LUNGS: on inspection no signs of respiratory distress, breathing rate appears normal, no  obvious gross SOB, gasping or wheezing  CV: no obvious cyanosis  MS: moves all visible extremities without noticeable abnormality  PSYCH/NEURO: pleasant and cooperative, no obvious depression or anxiety, speech and thought processing grossly intact  ASSESSMENT AND PLAN: Nocturia Assessment & Plan: Urine studies pended Unlikely OSA Consider trospium after urine studies obtained.  Counseled on anticholinergic side effects.  Orders: -     Urinalysis, Routine w reflex microscopic; Future -     Urine Culture; Future  Chronic pain of left knee Assessment & Plan: Chronic, suboptimal control.  Discussed icing, scheduling Tylenol arthritis. Provided exercises on AVS.  She will let me know if she like to pursue physical therapy and/ or updating imaging.      -we discussed possible serious and likely etiologies, options for evaluation and workup, limitations of telemedicine visit vs in person visit, treatment, treatment risks and precautions. Pt prefers to treat via telemedicine empirically rather then risking or undertaking an in person visit at this moment.    I discussed the assessment and treatment plan with the patient. The patient was provided an opportunity to ask questions and all were answered. The patient agreed with the plan and demonstrated an understanding of the instructions.   The patient was advised to call back or seek an in-person evaluation if the symptoms worsen or if the condition fails to improve as anticipated.  Advised if desired AVS can be mailed or viewed via MyChart if Mychart user.   [name], FNP

## 2024-01-01 NOTE — Assessment & Plan Note (Signed)
 Urine studies pended Unlikely OSA Consider trospium after urine studies obtained.  Counseled on anticholinergic side effects.

## 2024-01-01 NOTE — Patient Instructions (Addendum)
 Call to schedule urine studies  Consider trospium  Schedule tyleonol ; do not exceed 4g in 24 hrs.     Exercises for Chronic Knee Pain Chronic knee pain is pain that lasts longer than 3 months. For most people with chronic knee pain, exercise and weight loss is an important part of treatment. Your health care provider may want you to focus on: Making the muscles that support your knee stronger. This can take pressure off your knee and reduce pain. Preventing knee stiffness. How far you can move your knee, keeping it there or making it farther. Losing weight (if this applies) to take pressure off your knee, lower your risk for injury, and make it easier for you to exercise. Your provider will help you make an exercise program that fits your needs and physical abilities. Below are simple, low-impact exercises you can do at home. Ask your provider or physical therapist how often you should do your exercise program and how many times to repeat each exercise. General safety tips  Get your provider's approval before doing any exercises. Start slowly and stop any time you feel pain. Do not exercise if your knee pain is flaring up. Warm up first. Stretching a cold muscle can cause an injury. Do 5-10 minutes of easy movement or light stretching before beginning your exercises. Do 5-10 minutes of low-impact activity (like walking or cycling) before starting strengthening exercises. Contact your provider any time you have pain during or after exercising. Exercise can cause discomfort but should not be painful. It is normal to be a little stiff or sore after exercising. Stretching and range-of-motion exercises Front thigh stretch  Stand up straight and support your body by holding on to a chair or resting one hand on a wall. With your legs straight and close together, bend one knee to lift your heel up toward your butt. Using one hand for support, grab your ankle with your free hand. Pull your foot  up closer toward your butt to feel the stretch in front of your thigh. Hold the stretch for 30 seconds. Repeat __________ times. Complete this exercise __________ times a day. Back thigh stretch  Sit on the floor with your back straight and your legs out straight in front of you. Place the palms of your hands on the floor and slide them toward your feet as you bend at the hip. Try to touch your nose to your knees and feel the stretch in the back of your thighs. Hold for 30 seconds. Repeat __________ times. Complete this exercise __________ times a day. Calf stretch  Stand facing a wall. Place the palms of your hands flat against the wall, arms extended, and lean slightly against the wall. Get into a lunge position with one leg bent at the knee and the other leg stretched out straight behind you. Keep both feet facing the wall and increase the bend in your knee while keeping the heel of the other leg flat on the ground. You should feel the stretch in your calf. Hold for 30 seconds. Repeat __________ times. Complete this exercise __________ times a day. Strengthening exercises Straight leg lift  Lie on your back with one knee bent and the other leg out straight. Slowly lift the straight leg without bending the knee. Lift until your foot is about 12 inches (30 cm) off the floor. Hold for 3-5 seconds and slowly lower your leg. Repeat __________ times. Complete this exercise __________ times a day. Single leg dip  Stand  between two chairs and put both hands on the backs of the chairs for support. Extend one leg out straight with your body weight resting on the heel of the standing leg. Slowly bend your standing knee to dip your body to the level that is comfortable for you. Hold for 3-5 seconds. Repeat __________ times. Complete this exercise __________ times a day. Hamstring curls  Stand straight, knees close together, facing the back of a chair. Hold on to the back of a chair with  both hands. Keep one leg straight. Bend the other knee while bringing the heel up toward the butt until the knee is bent at a 90-degree angle (right angle). Hold for 3-5 seconds. Repeat __________ times. Complete this exercise __________ times a day. Wall squat  Stand straight with your back, hips, and head against a wall. Step forward one foot at a time with your back still against the wall. Your feet should be 2 feet (61 cm) from the wall at shoulder width. Keeping your back, hips, and head against the wall, slide down the wall to as close to a sitting position as you can get. Hold for 5-10 seconds, then slowly slide back up. Repeat __________ times. Complete this exercise __________ times a day. Step-ups  Stand in front of a sturdy platform or stool that is about 6 inches (15 cm) high. Slowly step up with your left / right foot, keeping your knee in line with your hip and foot. Do not let your knee bend so far that you cannot see your toes. Hold on to a chair for balance, but do not use it for support. Slowly unlock your knee and lower yourself to the starting position. Repeat __________ times. Complete this exercise __________ times a day. Contact a health care provider if: Your exercises cause pain. Your pain is worse after you exercise. Your pain prevents you from doing your exercises. This information is not intended to replace advice given to you by your health care provider. Make sure you discuss any questions you have with your health care provider. Document Revised: 03/08/2022 Document Reviewed: 03/08/2022 Elsevier Patient Education  2024 [employer].

## 2024-01-02 ENCOUNTER — Other Ambulatory Visit

## 2024-01-02 DIAGNOSIS — M25562 Pain in left knee: Secondary | ICD-10-CM | POA: Insufficient documentation

## 2024-01-02 NOTE — Assessment & Plan Note (Signed)
 Chronic, suboptimal control.  Discussed icing, scheduling Tylenol arthritis. Provided exercises on AVS.  She will let me know if she like to pursue physical therapy and/ or updating imaging.

## 2024-01-03 ENCOUNTER — Telehealth: Payer: Self-pay

## 2024-01-03 ENCOUNTER — Other Ambulatory Visit (INDEPENDENT_AMBULATORY_CARE_PROVIDER_SITE_OTHER)

## 2024-01-03 DIAGNOSIS — R351 Nocturia: Secondary | ICD-10-CM

## 2024-01-03 LAB — URINALYSIS, ROUTINE W REFLEX MICROSCOPIC
Bilirubin Urine: NEGATIVE
Ketones, ur: NEGATIVE
Nitrite: NEGATIVE
Specific Gravity, Urine: 1.02 (ref 1.000–1.030)
Total Protein, Urine: NEGATIVE
Urine Glucose: NEGATIVE
Urobilinogen, UA: 0.2 (ref 0.0–1.0)
pH: 6 (ref 5.0–8.0)

## 2024-01-03 NOTE — Telephone Encounter (Signed)
 Copied from CRM #[id number]. Topic: Clinical - Lab/Test Results >> Jan 03, 2024  3:27 PM China J wrote: Reason for CRM: The patient is calling because she was wondering if her provider could relay a response to her lab work since she saw them coming back as abnormal. She was wondering if an antibiotic needs to be prescribed if that would be able to be accomplished before she leaves for her trip Monday.

## 2024-01-03 NOTE — Telephone Encounter (Signed)
 LVM and sent my chart message to inform her that as soon as Rollene sees the results I will reach back out to you

## 2024-01-04 LAB — URINE CULTURE
MICRO NUMBER:: 17164076
SPECIMEN QUALITY:: ADEQUATE

## 2024-01-05 ENCOUNTER — Ambulatory Visit: Payer: Self-pay | Admitting: Family

## 2024-01-05 NOTE — Telephone Encounter (Signed)
 Copied from CRM #[id number]. Topic: Clinical - Lab/Test Results >> Jan 05, 2024  2:56 PM Elaine Graves wrote: Reason for CRM: Patient would like a call back pertaining to most recent abnormal labs. Patient is requesting a call back based off the results she would like to know if she needs a medication prescribed and afraid to go through the weekend without any medication.(Marked High Priority per CAL)    CB#[phone]

## 2024-01-05 NOTE — Telephone Encounter (Signed)
 Seen previous call note where Elaine Graves has NOT resulted that labs yet therefore I can not do anything until then

## 2024-01-08 NOTE — Telephone Encounter (Signed)
 Noted

## 2024-01-09 NOTE — Telephone Encounter (Signed)
 Pt has been notified of results and has viewed them via my chart

## 2024-01-18 ENCOUNTER — Other Ambulatory Visit (INDEPENDENT_AMBULATORY_CARE_PROVIDER_SITE_OTHER)

## 2024-01-18 ENCOUNTER — Telehealth: Payer: Self-pay

## 2024-01-18 DIAGNOSIS — R899 Unspecified abnormal finding in specimens from other organs, systems and tissues: Secondary | ICD-10-CM

## 2024-01-18 LAB — URINALYSIS, ROUTINE W REFLEX MICROSCOPIC
Bilirubin Urine: NEGATIVE
Hgb urine dipstick: NEGATIVE
Ketones, ur: NEGATIVE
Nitrite: NEGATIVE
Specific Gravity, Urine: 1.025 (ref 1.000–1.030)
Total Protein, Urine: NEGATIVE
Urine Glucose: NEGATIVE
Urobilinogen, UA: 0.2 (ref 0.0–1.0)
pH: 6 (ref 5.0–8.0)

## 2024-01-18 NOTE — Telephone Encounter (Signed)
 Copied from CRM [id number]. Topic: Clinical - Request for Lab/Test Order >> Jan 18, 2024  8:30 AM [name] wrote: Reason for CRM: Pt is calling that she is back from her trip and was told to go give another urine sample. There is not urine order placed. Please place order and call pt once order is placed so pt can come to office and give urine sample.

## 2024-01-18 NOTE — Addendum Note (Signed)
 Addended by: Kammie Scioli on: 01/18/2024 10:16 AM   Modules accepted: Orders

## 2024-01-18 NOTE — Telephone Encounter (Signed)
 LVM to inform pt that ua and uc have been ordered she can schedule appt

## 2024-01-19 LAB — URINE CULTURE
MICRO NUMBER:: 17231366
Result:: NO GROWTH
SPECIMEN QUALITY:: ADEQUATE

## 2024-01-19 NOTE — Telephone Encounter (Signed)
 Pt has made lab appt labs have resulted I informed her that as soon as provider sees them I will reach back out to her

## 2024-01-19 NOTE — Telephone Encounter (Signed)
 LVM and sent message via my chart per Rollene

## 2024-01-20 ENCOUNTER — Ambulatory Visit: Payer: Self-pay | Admitting: Family

## 2024-01-29 ENCOUNTER — Encounter: Payer: Self-pay | Admitting: Family

## 2024-02-01 ENCOUNTER — Other Ambulatory Visit: Payer: Self-pay | Admitting: Family

## 2024-02-01 DIAGNOSIS — N951 Menopausal and female climacteric states: Secondary | ICD-10-CM

## 2024-02-01 MED ORDER — ESTRADIOL 0.5 MG PO TABS: 0.5000 mg | ORAL_TABLET | Freq: Every day | ORAL | 0 refills | Status: AC

## 2024-03-12 ENCOUNTER — Ambulatory Visit

## 2024-05-06 ENCOUNTER — Ambulatory Visit: Admit: 2024-05-06 | Admitting: Gastroenterology

## 2024-11-25 ENCOUNTER — Encounter: Admitting: Family

## 2024-12-11 ENCOUNTER — Ambulatory Visit
